# Patient Record
Sex: Female | Born: 1990 | Race: Black or African American | Hispanic: No | Marital: Single | Smoking: Former smoker
Health system: Southern US, Community
[De-identification: ages and names within clinical notes are randomized; demographics above are authoritative.]

## PROBLEM LIST (undated history)

## (undated) DIAGNOSIS — J302 Other seasonal allergic rhinitis: Secondary | ICD-10-CM

## (undated) DIAGNOSIS — I10 Essential (primary) hypertension: Secondary | ICD-10-CM

## (undated) DIAGNOSIS — M3214 Glomerular disease in systemic lupus erythematosus: Secondary | ICD-10-CM

## (undated) DIAGNOSIS — G43909 Migraine, unspecified, not intractable, without status migrainosus: Secondary | ICD-10-CM

## (undated) DIAGNOSIS — R319 Hematuria, unspecified: Secondary | ICD-10-CM

## (undated) DIAGNOSIS — R809 Proteinuria, unspecified: Secondary | ICD-10-CM

## (undated) HISTORY — DX: Hematuria, unspecified: R31.9

## (undated) HISTORY — DX: Migraine, unspecified, not intractable, without status migrainosus: G43.909

## (undated) HISTORY — PX: NO PAST SURGERIES: SHX2092

## (undated) HISTORY — DX: Essential (primary) hypertension: I10

---

## 2012-10-04 ENCOUNTER — Other Ambulatory Visit: Payer: Self-pay | Admitting: *Deleted

## 2012-10-07 ENCOUNTER — Other Ambulatory Visit: Payer: Self-pay

## 2012-10-11 ENCOUNTER — Other Ambulatory Visit: Payer: Self-pay

## 2012-10-19 ENCOUNTER — Other Ambulatory Visit: Payer: Self-pay

## 2013-03-06 ENCOUNTER — Other Ambulatory Visit (HOSPITAL_COMMUNITY): Payer: Self-pay | Admitting: Family Medicine

## 2013-03-06 DIAGNOSIS — R1031 Right lower quadrant pain: Secondary | ICD-10-CM

## 2013-03-07 ENCOUNTER — Ambulatory Visit (HOSPITAL_COMMUNITY)
Admission: RE | Admit: 2013-03-07 | Discharge: 2013-03-07 | Disposition: A | Discharge status: Home / Self Care | Payer: BC Managed Care – PPO | Source: Ambulatory Visit | Attending: Family Medicine | Admitting: Family Medicine

## 2013-03-07 DIAGNOSIS — R1031 Right lower quadrant pain: Secondary | ICD-10-CM | POA: Insufficient documentation

## 2013-03-07 DIAGNOSIS — R319 Hematuria, unspecified: Secondary | ICD-10-CM | POA: Insufficient documentation

## 2013-03-20 ENCOUNTER — Other Ambulatory Visit (HOSPITAL_COMMUNITY): Payer: Self-pay | Admitting: Family Medicine

## 2013-03-20 DIAGNOSIS — R109 Unspecified abdominal pain: Secondary | ICD-10-CM

## 2013-03-28 ENCOUNTER — Ambulatory Visit (HOSPITAL_COMMUNITY)
Admission: RE | Admit: 2013-03-28 | Discharge: 2013-03-28 | Disposition: A | Discharge status: Home / Self Care | Payer: BC Managed Care – PPO | Source: Ambulatory Visit | Attending: Family Medicine | Admitting: Family Medicine

## 2013-03-28 ENCOUNTER — Other Ambulatory Visit (HOSPITAL_COMMUNITY): Payer: Self-pay | Admitting: Family Medicine

## 2013-03-28 DIAGNOSIS — N926 Irregular menstruation, unspecified: Secondary | ICD-10-CM | POA: Insufficient documentation

## 2013-03-28 DIAGNOSIS — N949 Unspecified condition associated with female genital organs and menstrual cycle: Secondary | ICD-10-CM | POA: Insufficient documentation

## 2013-03-28 DIAGNOSIS — R109 Unspecified abdominal pain: Secondary | ICD-10-CM

## 2013-05-22 ENCOUNTER — Encounter (HOSPITAL_COMMUNITY): Payer: Self-pay | Admitting: Emergency Medicine

## 2013-05-22 ENCOUNTER — Emergency Department (HOSPITAL_COMMUNITY)
Admission: EM | Admit: 2013-05-22 | Discharge: 2013-05-22 | Disposition: A | Discharge status: Home / Self Care | Payer: BC Managed Care – PPO | Attending: Emergency Medicine | Admitting: Emergency Medicine

## 2013-05-22 DIAGNOSIS — Z880 Allergy status to penicillin: Secondary | ICD-10-CM | POA: Insufficient documentation

## 2013-05-22 DIAGNOSIS — Z79899 Other long term (current) drug therapy: Secondary | ICD-10-CM | POA: Insufficient documentation

## 2013-05-22 DIAGNOSIS — L600 Ingrowing nail: Secondary | ICD-10-CM | POA: Insufficient documentation

## 2013-05-22 NOTE — Discharge Instructions (Signed)
Follow up with Podiatry as listed above for further evaluation of your ingrown toe nail. Resource guide provided below for further follow up. Avoid narrow toe shoes, keep feet clean, and recommend warm soaks. Return if you develop any increaseed swelling, redness or drainage from toe.    Ingrown Toenail An ingrown toenail occurs when the sharp edge of a toenail grows into the skin of the groove beside the nail (lateral nail groove), causing pain. Ingrown toenails most commonly occur on the first (big) toe. If left untreated, an ingrown toenail may become inflamed and infected. The infection can even spread throughout the toe. SYMPTOMS   Pain, centered on the nail groove.  Tenderness and inflammation.  Pus drainage (occasionally).  Signs of infection: redness, swelling, pain, warm to the touch. CAUSES  Ingrown toenails are caused when the toenail grows into the neighboring fleshy nail fold. This may be the result of poor nail trimming or pressure from shoes.  RISK INCREASES WITH:   Curved nail formations.  Clipping toenails too far on the sides, which allows tissue to grow over the nail.  Poorly fitted shoes, especially those that press down on the toenails.  Sports that require sudden stops, causing the toe to jam into the shoe. PREVENTION   Trim toenails properly, making sure the sides are not clipped too short.  Wear properly fitted shoes.  Avoid excessive pressure on the toenails. PROGNOSIS  Ingrown toenails are usually curable within 1 week, depending on the presence or absence of an infection.  RELATED COMPLICATIONS   Chronic infection, that cannot be cured without surgery.  Spread of infection throughout the toe, or even the bone. TREATMENT  Treatment first involves resting from aggravating activities, wearing shoes that do not place pressure on the toenails, antibiotics (if infected), and soaking the foot in warm water (with or without Epsom salt). After the foot has  been soaked, you may be able to gently lift the nail from the fleshy tissue, and place a bit of cotton ball under the nail, easing the pressure. If you have been prescribed antibiotics, expect relief to begin up to 48 hours after taking the medicine. Symptoms usually go away within 1 week. For people who suffer from persistent (chronic) ingrown toenails, surgery may be advised. MEDICATION   If pain medicine is needed, nonsteroidal anti-inflammatory medicines (aspirin and ibuprofen), or other minor pain relievers (acetaminophen), are often advised.  Do not take pain medicine for 7 days before surgery.  Stronger pain relievers may be prescribed, if your caregiver thinks they are needed. Use only as directed and only as much as you need.  Antibiotics may be prescribed to fight infection. Take as directed by your caregiver. Finish the entire prescription, even if you start to feel better before you finish it. SOAKS AND COLD THERAPY   Soak the toe (or whole foot) for 20 minutes, twice a day, in a gallon of warm water. You may add 2 tablespoons of Epsom salts or a mild detergent.  Cold treatment (icing) should be applied for 10 to 15 minutes every 2 to 3 hours for inflammation and pain, and immediately after activity that aggravates your symptoms. Use ice packs or an ice massage. SEEK MEDICAL CARE IF:   Symptoms get worse or do not improve in 48 hours, despite treatment.  You develop fever, increased pain and swelling, or signs of infection (pain, redness, tenderness, swelling, warmth) in the toe, after surgery.  New, unexplained symptoms develop. (Drugs used in treatment may produce  side effects.) Document Released: 02/02/2005 Document Revised: 04/27/2011 Document Reviewed: 05/17/2008 [facility] Patient Information 2014 [place], [place].

## 2013-05-22 NOTE — ED Notes (Signed)
Pt ambulatory to exam room with steady gait.  

## 2013-05-22 NOTE — ED Notes (Signed)
Pt reports ingrown toenail to R great toe. Pt states she tried to remove it but doe not think she got it all and that toe has a purple tint to it. Pt alert and ambulatory in triage.

## 2013-05-22 NOTE — ED Provider Notes (Signed)
CSN: [id number]     Arrival date & time 05/22/13  2016 History   First MD Initiated Contact with Patient 05/22/13 2134     This chart was scribed for non-physician practitioner, Rudene Anda PA-C working with Nelia Shi, MD by Arlan Organ, ED Scribe. This patient was seen in room WTR7/WTR7 and the patient's care was started at 9:59 PM.   Chief Complaint  Patient presents with  . Nail Problem   The history is provided by the patient. No language interpreter was used.    HPI Comments: Julia Hall is a 23 y.o. female who presents to the Emergency Department complaining of an ingrown toenail to the R great toe x 1 month. Pt states she tried to remove the toenail but feels she only partially removed the nail. She has noted a purple tint to the toe and feel it may be infected. She is now experiencing mild achy pain and rates it 6/10. States her discomfort is exacerbated when wearing shoes. At this time she denies any fever, chills, numbness, or tingling. She has no pertinent medical history. No other concerns this visit.   History reviewed. No pertinent past medical history. History reviewed. No pertinent past surgical history. History reviewed. No pertinent family history. History  Substance Use Topics  . Smoking status: Never Smoker   . Smokeless tobacco: Not on file  . Alcohol Use: No   OB History   Grav Para Term Preterm Abortions TAB SAB Ect Mult Living                 Review of Systems  A complete 10 system review of systems was obtained and all systems are negative except as noted in the HPI and PMH.    Allergies  Penicillins  Home Medications   Current Outpatient Rx  Name  Route  Sig  Dispense  Refill  . cetirizine (ZYRTEC) 10 MG tablet   Oral   Take 10 mg by mouth daily.         Marland Kitchen ibuprofen (ADVIL,MOTRIN) 200 MG tablet   Oral   Take 400 mg by mouth every 6 (six) hours as needed (pain).          Triage Vitals: BP 114/70  Pulse 84  Temp(Src) 97.6  F (36.4 C) (Oral)  Resp 20  Ht 5\' 6"  (1.676 m)  Wt 187 lb (84.823 kg)  BMI 30.20 kg/m2  SpO2 100%  LMP 05/22/2013   Physical Exam  Nursing note and vitals reviewed. Constitutional: She is oriented to person, place, and time. She appears well-developed and well-nourished. No distress.  HENT:  Head: Normocephalic and atraumatic.  Eyes: Conjunctivae are normal.  Neck: No tracheal deviation present.  Pulmonary/Chest: Effort normal. No respiratory distress.  Musculoskeletal: Normal range of motion. She exhibits tenderness. She exhibits no edema.  Ingrown toenails to lateral aspect of R great toe No fluctuance No signs of abscess Minimally swollen No erythema or warmness to touch No drainage  Neurological: She is alert and oriented to person, place, and time.  Skin: Skin is warm and dry. She is not diaphoretic.  Psychiatric: She has a normal mood and affect. Her behavior is normal.    ED Course  Procedures (including critical care time)  DIAGNOSTIC STUDIES: Oxygen Saturation is 100% on RA, Normal by my interpretation.    COORDINATION OF CARE: 10:05 PM-Discussed treatment plan with pt at bedside and pt agreed to plan.     Labs Review Labs Reviewed - No  data to display Imaging Review No results found.   EKG Interpretation None      MDM   Final diagnoses:  Nail, ingrown   No evidence of infection at this time. No need for emergent toenail removal. Will refer pt to podiatry for further evaluation and follow up. Advised pt to do warm foot soaks daily, along with wearing wide based shoes or opened toe sandals to avoid pressure to affected nail.    I personally performed the services described in this documentation, which was scribed in my presence. The recorded information has been reviewed and is accurate.   Allen NorrisJacob Gray LynnvilleLackey, PA-C 05/25/13 [phone]

## 2013-05-27 NOTE — ED Provider Notes (Signed)
Medical screening examination/treatment/procedure(s) were performed by non-physician practitioner and as supervising physician I was immediately available for consultation/collaboration.   Marlei Glomski L Charis Juliana, MD 05/27/13 1120 

## 2013-06-02 ENCOUNTER — Ambulatory Visit (INDEPENDENT_AMBULATORY_CARE_PROVIDER_SITE_OTHER): Payer: BC Managed Care – PPO

## 2013-06-02 VITALS — BP 120/74 | HR 72 | Resp 18

## 2013-06-02 DIAGNOSIS — L03039 Cellulitis of unspecified toe: Secondary | ICD-10-CM

## 2013-06-02 DIAGNOSIS — L600 Ingrowing nail: Secondary | ICD-10-CM

## 2013-06-02 MED ORDER — SULFAMETHOXAZOLE-TMP DS 800-160 MG PO TABS: 1.0000 | ORAL_TABLET | Freq: Two times a day (BID) | ORAL | Status: DC

## 2013-06-02 NOTE — Patient Instructions (Signed)

## 2013-06-02 NOTE — Progress Notes (Signed)
   Subjective:    Patient ID: Julia Hall, female    DOB: May 28, 1990, 23 y.o.   MRN: [id number]  HPI my right big toenail has been hurting over a month and I took tweezers and messed with it last night and yellowish color came out and hurts with shoes and I have not soaked it    Review of Systems  All other systems reviewed and are negative.      Objective:   Physical Exam Neurovascular status is intact pedal pulses palpable DP and PT posterior were for capillary refill time 3 seconds epicritic and proprioceptive sensations intact and symmetric. Patient did complain of ingrowing lateral border of right great toe been going on and had multiple episodes in the past most recent last 2-3 weeks is no edema erythema and some discharge drainage. Painful tender symptomatic has had problems in the left foot as well although currently left is not symptomatic. Neurovascular status otherwise intact no other open wounds ulcerations.       Assessment & Plan:  Assessment ingrowing nail lateral border right hallux with paronychia plan at this time reclamation for permanent nail excision lateral nail fold for nail border risk O. is reviewed with the patient this time local anesthetic block is administered 3 cc 50-50 mixture of 2% Xylocaine plain and 0.5% Marcaine plain Betadine prep was performed the nail border is excised phenol matricectomy followed by an alcohol wash Silvadene cream and gauze dressing being applied. Patient given instructions for daily Betadine warm water soak recommended Tylenol as the for pain prescription for Septra DS is also for the pharmacy Septra DS twice a day x10 days. Recommend Tylenol aspirin or Advil as needed for pain recheck in 2-3 weeks for postop followup for nail check  Alvan Dameichard Trueman Worlds DPM

## 2013-06-16 ENCOUNTER — Ambulatory Visit (INDEPENDENT_AMBULATORY_CARE_PROVIDER_SITE_OTHER): Payer: BC Managed Care – PPO

## 2013-06-16 VITALS — BP 108/65 | HR 78 | Resp 12

## 2013-06-16 DIAGNOSIS — Z09 Encounter for follow-up examination after completed treatment for conditions other than malignant neoplasm: Secondary | ICD-10-CM

## 2013-06-16 DIAGNOSIS — L600 Ingrowing nail: Secondary | ICD-10-CM

## 2013-06-16 NOTE — Patient Instructions (Signed)

## 2013-06-16 NOTE — Progress Notes (Signed)
   Subjective:    Patient ID: Julia Hall, female    DOB: 1990-04-12, 23 y.o.   MRN: [id number]  HPI   RT FOOT GREAT TOENAIL IS DOING MUCH BETTER.   Review of Systems no new systemic changes or findings     Objective:   Physical Exam NV SI, pedal pulses palpable no pain or tenderness mild eschar noted lateral nail fold right great toe. Patient is two-week status post AP nail procedure doing well keeping Neosporin and Band-Aid on during the day but it air dry during the night     Assessment & Plan:  Assessment good postop progress discharge to an as-needed basis for followup Neosporin and Band-Aid applied the skin may discontinue was drainage completely stopped discharge to an as-needed basis for followup  Julia Hall DPM

## 2013-09-06 ENCOUNTER — Emergency Department (HOSPITAL_COMMUNITY)
Admission: EM | Admit: 2013-09-06 | Discharge: 2013-09-07 | Disposition: A | Discharge status: Home / Self Care | Payer: BC Managed Care – PPO | Attending: Emergency Medicine | Admitting: Emergency Medicine

## 2013-09-06 ENCOUNTER — Emergency Department (HOSPITAL_COMMUNITY): Payer: BC Managed Care – PPO

## 2013-09-06 ENCOUNTER — Encounter (HOSPITAL_COMMUNITY): Payer: Self-pay | Admitting: Emergency Medicine

## 2013-09-06 DIAGNOSIS — Z79899 Other long term (current) drug therapy: Secondary | ICD-10-CM | POA: Insufficient documentation

## 2013-09-06 DIAGNOSIS — R1031 Right lower quadrant pain: Secondary | ICD-10-CM | POA: Insufficient documentation

## 2013-09-06 DIAGNOSIS — K5900 Constipation, unspecified: Secondary | ICD-10-CM | POA: Insufficient documentation

## 2013-09-06 DIAGNOSIS — Z880 Allergy status to penicillin: Secondary | ICD-10-CM | POA: Insufficient documentation

## 2013-09-06 DIAGNOSIS — Z3202 Encounter for pregnancy test, result negative: Secondary | ICD-10-CM | POA: Insufficient documentation

## 2013-09-06 LAB — URINALYSIS, ROUTINE W REFLEX MICROSCOPIC
Bilirubin Urine: NEGATIVE
GLUCOSE, UA: NEGATIVE mg/dL
Hgb urine dipstick: NEGATIVE
Ketones, ur: NEGATIVE mg/dL
LEUKOCYTES UA: NEGATIVE
Nitrite: NEGATIVE
PROTEIN: 100 mg/dL — AB
SPECIFIC GRAVITY, URINE: 1.026 (ref 1.005–1.030)
UROBILINOGEN UA: 1 mg/dL (ref 0.0–1.0)
pH: 6.5 (ref 5.0–8.0)

## 2013-09-06 LAB — COMPREHENSIVE METABOLIC PANEL
ALT: 45 U/L — ABNORMAL HIGH (ref 0–35)
AST: 30 U/L (ref 0–37)
Albumin: 4.2 g/dL (ref 3.5–5.2)
Alkaline Phosphatase: 85 U/L (ref 39–117)
Anion gap: 14 (ref 5–15)
BILIRUBIN TOTAL: 0.4 mg/dL (ref 0.3–1.2)
BUN: 10 mg/dL (ref 6–23)
CALCIUM: 9.7 mg/dL (ref 8.4–10.5)
CHLORIDE: 100 meq/L (ref 96–112)
CO2: 25 meq/L (ref 19–32)
Creatinine, Ser: 0.64 mg/dL (ref 0.50–1.10)
GFR calc Af Amer: >90 mL/min (ref >90)
Glucose, Bld: 85 mg/dL (ref 70–99)
Potassium: 4 mEq/L (ref 3.7–5.3)
Sodium: 139 mEq/L (ref 137–147)
Total Protein: 8.3 g/dL (ref 6.0–8.3)

## 2013-09-06 LAB — CBC WITH DIFFERENTIAL/PLATELET
Basophils Absolute: 0 10*3/uL (ref 0.0–0.1)
Basophils Relative: 0 % (ref 0–1)
EOS PCT: 1 % (ref 0–5)
Eosinophils Absolute: 0.1 10*3/uL (ref 0.0–0.7)
HEMATOCRIT: 43.9 % (ref 36.0–46.0)
HEMOGLOBIN: 14.6 g/dL (ref 12.0–15.0)
LYMPHS ABS: 2.6 10*3/uL (ref 0.7–4.0)
LYMPHS PCT: 30 % (ref 12–46)
MCH: 28.5 pg (ref 26.0–34.0)
MCHC: 33.3 g/dL (ref 30.0–36.0)
MCV: 85.6 fL (ref 78.0–100.0)
MONO ABS: 0.6 10*3/uL (ref 0.1–1.0)
MONOS PCT: 6 % (ref 3–12)
NEUTROS ABS: 5.5 10*3/uL (ref 1.7–7.7)
Neutrophils Relative %: 63 % (ref 43–77)
Platelets: 261 10*3/uL (ref 150–400)
RBC: 5.13 MIL/uL — AB (ref 3.87–5.11)
RDW: 13.5 % (ref 11.5–15.5)
WBC: 8.8 10*3/uL (ref 4.0–10.5)

## 2013-09-06 LAB — PREGNANCY, URINE: Preg Test, Ur: NEGATIVE

## 2013-09-06 LAB — WET PREP, GENITAL
Clue Cells Wet Prep HPF POC: NONE SEEN
Trich, Wet Prep: NONE SEEN
Yeast Wet Prep HPF POC: NONE SEEN

## 2013-09-06 LAB — URINE MICROSCOPIC-ADD ON

## 2013-09-06 LAB — LIPASE, BLOOD: Lipase: 31 U/L (ref 11–59)

## 2013-09-06 MED ORDER — MORPHINE SULFATE 4 MG/ML IJ SOLN
4.0000 mg | Freq: Once | INTRAMUSCULAR | Status: AC
  Administered 2013-09-06: 4 mg via INTRAVENOUS
  Filled 2013-09-06: qty 1

## 2013-09-06 MED ORDER — IOHEXOL 300 MG/ML  SOLN
50.0000 mL | Freq: Once | INTRAMUSCULAR | Status: AC | PRN
  Administered 2013-09-06: 50 mL via ORAL

## 2013-09-06 MED ORDER — ONDANSETRON HCL 4 MG PO TABS: 4.0000 mg | ORAL_TABLET | Freq: Four times a day (QID) | ORAL | Status: DC

## 2013-09-06 MED ORDER — IOHEXOL 300 MG/ML  SOLN
100.0000 mL | Freq: Once | INTRAMUSCULAR | Status: AC | PRN
  Administered 2013-09-06: 100 mL via INTRAVENOUS

## 2013-09-06 MED ORDER — ONDANSETRON HCL 4 MG/2ML IJ SOLN
4.0000 mg | Freq: Once | INTRAMUSCULAR | Status: AC
  Administered 2013-09-06: 4 mg via INTRAVENOUS
  Filled 2013-09-06: qty 2

## 2013-09-06 MED ORDER — HYDROCODONE-ACETAMINOPHEN 5-325 MG PO TABS: 1.0000 | ORAL_TABLET | Freq: Four times a day (QID) | ORAL | Status: DC | PRN

## 2013-09-06 NOTE — ED Provider Notes (Signed)
CSN: [id number]     Arrival date & time 09/06/13  1912 History   First MD Initiated Contact with Patient 09/06/13 1949     Chief Complaint  Patient presents with  . Abdominal Pain  . Constipation     (Consider location/radiation/quality/duration/timing/severity/associated sxs/prior Treatment) HPI Comments: Patient is an otherwise healthy 23 year old female who presents today with 2 days of gradually worsening abdominal pain. She states that it is a sharp, stabbing pain. It does not radiate. The pain has been constant. Nothing makes the pain better or worse. She does not have associated nausea, vomiting, diarrhea, dysuria, urinary urgency, urinary frequency, vaginal discharge, vaginal bleeding. She does report constipation. She had a normal bowel movement yesterday, but normally has bowel movements daily.   The history is provided by the patient. No language interpreter was used.    History reviewed. No pertinent past medical history. History reviewed. No pertinent past surgical history. No family history on file. History  Substance Use Topics  . Smoking status: Never Smoker   . Smokeless tobacco: Never Used  . Alcohol Use: No   OB History   Grav Para Term Preterm Abortions TAB SAB Ect Mult Living                 Review of Systems  Constitutional: Negative for fever and chills.  Respiratory: Negative for shortness of breath.   Cardiovascular: Negative for chest pain.  Gastrointestinal: Positive for abdominal pain. Negative for nausea, vomiting and diarrhea.  Genitourinary: Negative for dysuria, frequency, flank pain, vaginal bleeding, vaginal discharge, difficulty urinating and vaginal pain.  All other systems reviewed and are negative.     Allergies  Penicillins  Home Medications   Prior to Admission medications   Medication Sig Start Date End Date Taking? Authorizing Provider  cetirizine (ZYRTEC) 10 MG tablet Take 10 mg by mouth daily.   Yes Historical Provider, MD    BP 116/64  Pulse 89  Temp(Src) 98.1 F (36.7 C) (Oral)  Resp 16  Ht 5\' 6"  (1.676 m)  Wt 189 lb (85.73 kg)  BMI 30.52 kg/m2  SpO2 100%  LMP 08/07/2013 Physical Exam  Nursing note and vitals reviewed. Constitutional: She is oriented to person, place, and time. She appears well-developed and well-nourished. No distress.  Patient appears comfortable.   HENT:  Head: Normocephalic and atraumatic.  Right Ear: External ear normal.  Left Ear: External ear normal.  Nose: Nose normal.  Mouth/Throat: Oropharynx is clear and moist.  Eyes: Conjunctivae are normal.  Neck: Normal range of motion.  Cardiovascular: Normal rate, regular rhythm and normal heart sounds.   Pulmonary/Chest: Effort normal and breath sounds normal. No stridor. No respiratory distress. She has no wheezes. She has no rales.  Abdominal: Soft. Bowel sounds are normal. She exhibits no distension. There is tenderness in the right lower quadrant. There is no rigidity, no rebound and no guarding.  Tenderness to deep palpation of RLQ  Genitourinary: There is no rash, tenderness or lesion on the right labia. There is no rash, tenderness or lesion on the left labia. Uterus is not tender. Cervix exhibits no motion tenderness, no discharge and no friability. Right adnexum displays tenderness. Right adnexum displays no mass and no fullness. Left adnexum displays tenderness. Left adnexum displays no mass and no fullness. No erythema, tenderness or bleeding around the vagina. No foreign body around the vagina. No signs of injury around the vagina. No vaginal discharge found.  Musculoskeletal: Normal range of motion.  Neurological: She  is alert and oriented to person, place, and time. She has normal strength.  Skin: Skin is warm and dry. She is not diaphoretic. No erythema.  Psychiatric: She has a normal mood and affect. Her behavior is normal.    ED Course  Procedures (including critical care time) Labs Review Labs Reviewed  WET  PREP, GENITAL - Abnormal; Notable for the following:    WBC, Wet Prep HPF POC FEW (*)    All other components within normal limits  CBC WITH DIFFERENTIAL - Abnormal; Notable for the following:    RBC 5.13 (*)    All other components within normal limits  COMPREHENSIVE METABOLIC PANEL - Abnormal; Notable for the following:    ALT 45 (*)    All other components within normal limits  URINALYSIS, ROUTINE W REFLEX MICROSCOPIC - Abnormal; Notable for the following:    APPearance CLOUDY (*)    Protein, ur 100 (*)    All other components within normal limits  URINE MICROSCOPIC-ADD ON - Abnormal; Notable for the following:    Squamous Epithelial / LPF FEW (*)    Bacteria, UA FEW (*)    All other components within normal limits  URINE CULTURE  GC/CHLAMYDIA PROBE AMP  LIPASE, BLOOD  PREGNANCY, URINE  POC URINE PREG, ED    Imaging Review Ct Abdomen Pelvis W Contrast  09/06/2013   CLINICAL DATA:  Rebound tenderness at the right lower quadrant.  EXAM: CT ABDOMEN AND PELVIS WITH CONTRAST  TECHNIQUE: Multidetector CT imaging of the abdomen and pelvis was performed using the standard protocol following bolus administration of intravenous contrast.  CONTRAST:  100 mL of Omnipaque 300 IV contrast  COMPARISON:  Pelvic ultrasound performed 03/28/2013  FINDINGS: The visualized lung bases are clear.  The liver and spleen are unremarkable in appearance. The gallbladder is within normal limits. The pancreas and adrenal glands are unremarkable.  The kidneys are unremarkable in appearance. There is no evidence of hydronephrosis. No renal or ureteral stones are seen. No perinephric stranding is appreciated.  No free fluid is identified. The small bowel is unremarkable in appearance. The stomach is within normal limits. No acute vascular abnormalities are seen.  The appendix is only partially characterized but is filled with air, without evidence for appendicitis. The colon is unremarkable in appearance.  The bladder  is mildly distended and grossly unremarkable. The uterus is within normal limits. The ovaries are relatively symmetric. No suspicious adnexal masses are seen. No inguinal lymphadenopathy is seen.  No acute osseous abnormalities are identified.  IMPRESSION: No acute abnormality seen within the abdomen or pelvis.   Electronically Signed   By: Roanna RaiderJeffery  Chang M.D.   On: 09/06/2013 23:33     EKG Interpretation None      MDM   Final diagnoses:  Right lower quadrant abdominal pain    Patient is nontoxic, nonseptic appearing, in no apparent distress. Patient's pain and other symptoms adequately managed in emergency department.  Fluid bolus given.  Labs, imaging and vitals reviewed.  Patient does not meet the SIRS or Sepsis criteria.  On repeat exam patient does not have a surgical abdomen and there are no peritoneal signs.  No indication of appendicitis, bowel obstruction, bowel perforation, cholecystitis, diverticulitis, PID, TOA, ovarian torsion, or ectopic pregnancy. Discussed possibility of ovarian cyst with patient. Patient discharged home with symptomatic treatment and given strict instructions for follow-up with their primary care physician.  I have also discussed reasons to return immediately to the ER.  Patient expresses understanding  and agrees with plan. Dr. Freida Busman evaluated patient and agrees with plan.        Mora Bellman, PA-C 09/07/13 0104

## 2013-09-06 NOTE — ED Notes (Signed)
Pt ambulatory to room.

## 2013-09-06 NOTE — ED Notes (Signed)
Pt is aware of the need for urine sample.  

## 2013-09-06 NOTE — ED Provider Notes (Signed)
Medical screening examination/treatment/procedure(s) were conducted as a shared visit with non-physician practitioner(s) and myself.  I personally evaluated the patient during the encounter.   EKG Interpretation None     Patient here with 2 days of colicky right lower quadrant pain. Abdominal exam shows tenderness to palpation the patient's right lower quadrant without peritoneal signs. Will obtain abdominal CT.  Toy BakerAnthony T Ardell Makarewicz, MD 09/06/13 2201

## 2013-09-06 NOTE — ED Notes (Signed)
Patient transported to CT 

## 2013-09-06 NOTE — Discharge Instructions (Signed)
Abdominal Pain, Women °Abdominal (stomach, pelvic, or belly) pain can be caused by many things. It is important to tell your doctor: °· The location of the pain. °· Does it come and go or is it present all the time? °· Are there things that start the pain (eating certain foods, exercise)? °· Are there other symptoms associated with the pain (fever, nausea, vomiting, diarrhea)? °All of this is helpful to know when trying to find the cause of the pain. °CAUSES  °· Stomach: virus or bacteria infection, or ulcer. °· Intestine: appendicitis (inflamed appendix), regional ileitis (Crohn's disease), ulcerative colitis (inflamed colon), irritable bowel syndrome, diverticulitis (inflamed diverticulum of the colon), or cancer of the stomach or intestine. °· Gallbladder disease or stones in the gallbladder. °· Kidney disease, kidney stones, or infection. °· Pancreas infection or cancer. °· Fibromyalgia (pain disorder). °· Diseases of the female organs: °¨ Uterus: fibroid (non-cancerous) tumors or infection. °¨ Fallopian tubes: infection or tubal pregnancy. °¨ Ovary: cysts or tumors. °¨ Pelvic adhesions (scar tissue). °¨ Endometriosis (uterus lining tissue growing in the pelvis and on the pelvic organs). °¨ Pelvic congestion syndrome (female organs filling up with blood just before the menstrual period). °¨ Pain with the menstrual period. °¨ Pain with ovulation (producing an egg). °¨ Pain with an IUD (intrauterine device, birth control) in the uterus. °¨ Cancer of the female organs. °· Functional pain (pain not caused by a disease, may improve without treatment). °· Psychological pain. °· Depression. °DIAGNOSIS  °Your doctor will decide the seriousness of your pain by doing an examination. °· Blood tests. °· X-rays. °· Ultrasound. °· CT scan (computed tomography, special type of X-ray). °· MRI (magnetic resonance imaging). °· Cultures, for infection. °· Barium enema (dye inserted in the large intestine, to better view it with  X-rays). °· Colonoscopy (looking in intestine with a lighted tube). °· Laparoscopy (minor surgery, looking in abdomen with a lighted tube). °· Major abdominal exploratory surgery (looking in abdomen with a large incision). °TREATMENT  °The treatment will depend on the cause of the pain.  °· Many cases can be observed and treated at home. °· Over-the-counter medicines recommended by your caregiver. °· Prescription medicine. °· Antibiotics, for infection. °· Birth control pills, for painful periods or for ovulation pain. °· Hormone treatment, for endometriosis. °· Nerve blocking injections. °· Physical therapy. °· Antidepressants. °· Counseling with a psychologist or psychiatrist. °· Minor or major surgery. °HOME CARE INSTRUCTIONS  °· Do not take laxatives, unless directed by your caregiver. °· Take over-the-counter pain medicine only if ordered by your caregiver. Do not take aspirin because it can cause an upset stomach or bleeding. °· Try a clear liquid diet (broth or water) as ordered by your caregiver. Slowly move to a bland diet, as tolerated, if the pain is related to the stomach or intestine. °· Have a thermometer and take your temperature several times a day, and record it. °· Bed rest and sleep, if it helps the pain. °· Avoid sexual intercourse, if it causes pain. °· Avoid stressful situations. °· Keep your follow-up appointments and tests, as your caregiver orders. °· If the pain does not go away with medicine or surgery, you may try: °¨ Acupuncture. °¨ Relaxation exercises (yoga, meditation). °¨ Group therapy. °¨ Counseling. °SEEK MEDICAL CARE IF:  °· You notice certain foods cause stomach pain. °· Your home care treatment is not helping your pain. °· You need stronger pain medicine. °· You want your IUD removed. °· You feel faint or   lightheaded. °· You develop nausea and vomiting. °· You develop a rash. °· You are having side effects or an allergy to your medicine. °SEEK IMMEDIATE MEDICAL CARE IF:  °· Your  pain does not go away or gets worse. °· You have a fever. °· Your pain is felt only in portions of the abdomen. The right side could possibly be appendicitis. The left lower portion of the abdomen could be colitis or diverticulitis. °· You are passing blood in your stools (bright red or black tarry stools, with or without vomiting). °· You have blood in your urine. °· You develop chills, with or without a fever. °· You pass out. °MAKE SURE YOU:  °· Understand these instructions. °· Will watch your condition. °· Will get help right away if you are not doing well or get worse. °Document Released: 11/30/2006 Document Revised: 06/19/2013 Document Reviewed: 12/20/2008 °[employer]® Patient Information ©2015 [employer]. This information is not intended to replace advice given to you by your health care provider. Make sure you discuss any questions you have with your health care provider. ° °

## 2013-09-06 NOTE — ED Notes (Addendum)
Pt states she started having RLQ ABD pain two days ago. Pt also states she has been constipated. Denies n/v, burning or painful urination, or abnormal vaginal discharge.

## 2013-09-06 NOTE — ED Notes (Addendum)
Pt c/o rebound tenderness in RLQ and notes that pain increases "a little bit" when she walks. Pt still has appendix. Pt sts last bowel movement was two days ago.

## 2013-09-07 LAB — GC/CHLAMYDIA PROBE AMP
CT PROBE, AMP APTIMA: NEGATIVE
GC Probe RNA: NEGATIVE

## 2013-09-07 NOTE — ED Provider Notes (Signed)
Medical screening examination/treatment/procedure(s) were performed by non-physician practitioner and as supervising physician I was immediately available for consultation/collaboration.  Claiborne Stroble T Erwin Nishiyama, MD 09/07/13 2136 

## 2013-09-08 LAB — URINE CULTURE: Colony Count: 5000

## 2013-11-06 ENCOUNTER — Encounter (HOSPITAL_COMMUNITY): Payer: Self-pay | Admitting: Emergency Medicine

## 2013-11-06 ENCOUNTER — Emergency Department (HOSPITAL_COMMUNITY)
Admission: EM | Admit: 2013-11-06 | Discharge: 2013-11-06 | Disposition: A | Discharge status: Home / Self Care | Payer: BC Managed Care – PPO | Attending: Emergency Medicine | Admitting: Emergency Medicine

## 2013-11-06 DIAGNOSIS — R509 Fever, unspecified: Secondary | ICD-10-CM | POA: Insufficient documentation

## 2013-11-06 DIAGNOSIS — Z880 Allergy status to penicillin: Secondary | ICD-10-CM | POA: Insufficient documentation

## 2013-11-06 DIAGNOSIS — R059 Cough, unspecified: Secondary | ICD-10-CM | POA: Insufficient documentation

## 2013-11-06 DIAGNOSIS — H6692 Otitis media, unspecified, left ear: Secondary | ICD-10-CM

## 2013-11-06 DIAGNOSIS — Z3202 Encounter for pregnancy test, result negative: Secondary | ICD-10-CM | POA: Insufficient documentation

## 2013-11-06 DIAGNOSIS — H6690 Otitis media, unspecified, unspecified ear: Secondary | ICD-10-CM | POA: Insufficient documentation

## 2013-11-06 DIAGNOSIS — J029 Acute pharyngitis, unspecified: Secondary | ICD-10-CM | POA: Insufficient documentation

## 2013-11-06 DIAGNOSIS — R05 Cough: Secondary | ICD-10-CM | POA: Insufficient documentation

## 2013-11-06 DIAGNOSIS — Z79899 Other long term (current) drug therapy: Secondary | ICD-10-CM | POA: Insufficient documentation

## 2013-11-06 DIAGNOSIS — J3489 Other specified disorders of nose and nasal sinuses: Secondary | ICD-10-CM | POA: Insufficient documentation

## 2013-11-06 LAB — RAPID STREP SCREEN (MED CTR MEBANE ONLY): STREPTOCOCCUS, GROUP A SCREEN (DIRECT): NEGATIVE

## 2013-11-06 LAB — PREGNANCY, URINE: PREG TEST UR: NEGATIVE

## 2013-11-06 MED ORDER — CLINDAMYCIN HCL 150 MG PO CAPS: 450.0000 mg | ORAL_CAPSULE | Freq: Three times a day (TID) | ORAL | Status: DC

## 2013-11-06 NOTE — ED Notes (Signed)
Pt states she is having flu like symptoms of chills, fever, runny nose, cough and sneezing x 4 days.

## 2013-11-06 NOTE — Discharge Instructions (Signed)
Please follow the directions provided.  Be sure to follow up with your primary care provider.    SEEK IMMEDIATE MEDICAL CARE IF:  You have pain that is not controlled with medicine.  You have swelling, redness, or pain around your ear or stiffness in your neck.  You notice that part of your face is paralyzed.  You notice that the bone behind your ear (mastoid) is tender when you touch it.   Emergency Department Resource Guide 1) Find a Doctor and Pay Out of Pocket Although you won't have to find out who is covered by your insurance plan, it is a good idea to ask around and get recommendations. You will then need to call the office and see if the doctor you have chosen will accept you as a new patient and what types of options they offer for patients who are self-pay. Some doctors offer discounts or will set up payment plans for their patients who do not have insurance, but you will need to ask so you aren't surprised when you get to your appointment.  2) Contact Your Local Health Department Not all health departments have doctors that can see patients for sick visits, but many do, so it is worth a call to see if yours does. If you don't know where your local health department is, you can check in your phone book. The CDC also has a tool to help you locate your state's health department, and many state websites also have listings of all of their local health departments.  3) Find a Walk-in Clinic If your illness is not likely to be very severe or complicated, you may want to try a walk in clinic. These are popping up all over the country in pharmacies, drugstores, and shopping centers. They're usually staffed by nurse practitioners or physician assistants that have been trained to treat common illnesses and complaints. They're usually fairly quick and inexpensive. However, if you have serious medical issues or chronic medical problems, these are probably not your best option.  No Primary Care  Doctor: - Call Health Connect at  [phone] - they can help you locate a primary care doctor that  accepts your insurance, provides certain services, etc. - Physician Referral Service- [phone]  Chronic Pain Problems: Organization         Address  Phone   Notes  [facility]  [phone] Patients need to be referred by their primary care doctor.   Medication Assistance: Organization         Address  Phone   Notes  [facility] Medication [facility] [address] [place] [place], [place] [address] [phone] --Must be a resident of [facility] -- Must have NO insurance coverage whatsoever (no Medicaid/ Medicare, etc.) -- The pt. MUST have a primary care doctor that directs their care regularly and follows them in the community   [facility]ist  [phone]   [employer]  [phone]    Agencies that provide inexpensive medical care: Organization         Address  Phone   Notes  [facility]  [phone]   [facility]    [phone]   [facility] [address] [place], [place] [address] [phone]   [facility] [address] [place] [place] [phone]   Planned Parenthood    [phone]   [facility]    [phone]  [facility]  [address], [place] Phone:  [phone], Fax:  [phone] Hours of Operation:  9 am - 6 pm, M-F.  Also accepts Medicaid/Medicare and self-pay.  [facility]  [address], [place] Phone: [phone], Fax: [phone]. Hours of Operation:  8:30 am - 5:30 pm, M-F.  Also accepts Medicaid and self-pay.  [facility] [place] [address], [place] Phone: [phone]   [facility] [address], [place], [place] [phone], Ext. [phone] Mondays & Thursdays: 7-9 AM.  First 15 patients are seen on a first  come, first serve basis.    Medicaid-accepting [facility] Providers:  Organization         Address  Phone   Notes  [facility] [address], [place] [phone] Also accepts self-pay patients.  [facility] [address], [place]  [phone]   [facility] [address], [place] [phone]) [phone]   [facility] [address] [place] [address], [place] [phone]   Renaye RakersVeita Bland [address], [place]   [phone]) [phone] Only accepts [place] Access [place]icaid patients after they have their name applied to their card.   Self-Pay (no insurance) in [facility]:  Organization         Address  Phone   Notes  Sickle Cell Patients, [facility] [address], [place] [phone]   [facility] [address], [place] [phone]   [facility]  [address], [place] [phone](336) [phone]   [facility]/Dr. Osei-Bonsu  [address] [place] [address], [place] or [phone], [place] [phone]) [phone] Phone number for both [place] and [place] locations is the same.  [facility] [address], [place] [phone]) [phone]   [facility] [place] [address] [place] [address], [place] or [address] [phone]) [phone]   [facility] [address], [place] [phone]) [phone], phone; [phone], fax Sees patients 1st and 3rd Saturday of every month.  Must not qualify for public or private insurance (i.e. Medicaid, Medicare, [place] Health Choice, Veterans' Benefits)  Household income should be no more than 200% of the poverty level The clinic cannot treat you if you are pregnant or think you are pregnant  Sexually transmitted diseases are not treated at the clinic.    Dental Care: Organization          Address  Phone  Notes  [facility] Department of [facility] [address], [place] [phone]) [phone] Accepts children up to age 22 who are enrolled in [place]icaid or [place] Health Choice; pregnant women with a Medicaid card; and children who have applied for Medicaid or [place] Health Choice, but were declined, whose parents can pay a reduced fee at time of service.  [facility] of [facility] [place]  [address], [place] [phone] Accepts children up to age 22 who are enrolled in [place]icaid or [place] Health Choice; pregnant women with a Medicaid card; and children who have applied for Medicaid or [place] Health Choice, but were declined, whose parents can pay a reduced fee at time of service.  [facility]  [phone]  [address], [place] [phone]) [phone] Patients are seen by appointment only. Walk-ins are not accepted. [facility] will see patients 22 years of age and older. Monday - Tuesday (8am-5pm) Most Wednesdays (8:30-5pm) $30 per visit, cash only  [facility]  [address], [facility] [phone] Patients are seen by appointment only. Walk-ins are not accepted. [facility] will see patients 22 years of age and older. One Wednesday Evening (Monthly: Volunteer Based).  $30 per visit, cash only  [facility]  [phone] for adults; Children under age 154, call Graduate Pediatric Dentistry at [phone]. Children aged 464-14, please call [phone] to request a pediatric application.  Dental services are provided in all areas of dental care including fillings, crowns and bridges, complete and partial dentures, implants, gum treatment, root canals, and extractions. Preventive care is also provided. Treatment is provided to both adults and children. Patients are selected via a lottery and there is often a waiting list.   [facility] [address], [place]  [phone] [email or web address]   [facility] [address], [place], [place] [phone], Ext. [phone] Second and Fourth Thursday of each month, opens at 6:30 AM; Clinic ends at 9 AM.  Patients are seen on a first-come first-served basis, and a limited number are seen during each clinic.   [facility]  [address], [place], [place] [phone]) [phone]   Eligibility Requirements You must have lived in [place], [place], or [place] counties for at least the last three months.   You cannot be eligible for state or federal sponsored National Cityhealthcare insurance, including CIGNAVeterans Administration, [place]icaid, or [employer]Medicare.   You generally cannot be eligible for healthcare insurance through your employer.    How to apply: Eligibility screenings are held every Tuesday and Wednesday afternoon from 1:00 pm until 4:00 pm. You do not need an appointment for the interview!  [facility] [address], [place], [place] [phone]   [facility]  [phone]   [facility]  [phone]   [facility]  [phone]    Behavioral Health Resources in the Community: Intensive Outpatient Programs Organization         Address  Phone  Notes  [facility] [address], [place], [place] [phone]   [facility] [address], [place], [place] [phone]   [facility] [address], [place], [place]  [phone]   [facility] [address],  [place], [place] [phone] or [phone]   Substance Abuse Resources Organization         Address  Phone  Notes  [facility]  [phone]   [facility]  [phone]   [facility]  [phone]   [facility]  [phone]   Residential & Outpatient Substance Abuse Program  [phone]   Psychological  Services Organization         Address  Phone  Notes  [facility]  [phone]- [phone]   [facility]  [phone]- [phone]   [facility] [address], [place] [phone] or [phone]    Mobile Crisis Teams Organization         Address  Phone  Notes  [facility], Mobile Crisis Care Unit  [phone]   [facility]  [address] [place], [place] [phone]  [facility] [address] [place] [phone]    Self-Help/Support Groups Organization         Address  Phone             Notes  [facility] [place] - variety of support groups  336- I7437963 Call for more information  Narcotics Anonymous (NA), [facility]aring [facility] [address], [employer] [place]  2 meetings at this location   Statistician         Address  Phone  Notes  [facility] [address],    [place]  [phone]   [facility]  [address], [place] [address], [place], [place] [phone]   [facility] [address] [place], [place] [phone] Admissions: 8am-3pm M-F  [facility] [address]    [place], [place] [phone]   [facility] [address], [place], [place] [phone]   [facility] [address],  [place], [place] [phone]   [facility] - Intensive Outpatient [address], [place], [place] [phone]   [facility] ([facility] [address] [place].,  [place], [place] [phone] or [phone]   [facility] (RT[facility]) [address], [place], [place] [phone] Accepts Medicaid  [facility] [address]  [place] [phone] Substance Abuse/Addiction Treatment   [facility]         Address  Phone  Notes  [facility]  [phone]   Angie Fava, PhD [address] [place], [place]   [phone] or [phone]   [facility]   [address] [place], [place] [phone]   [facility] [address] [place], [place] [phone] Insurance/Medicaid/sponsorship through [facility] and Families [address]                                    [place], [place] [phone] Therapy/tele-psych/case  [facility] [address], [place] [phone]    Dr. Lolly Mustache  [phone]   [facility]  [employer] [facility] Dept. 1) [address], [place] 2) [address], [place] 3)  [address], [place] [phone]  [phone]   [facility] Child Abuse Hotline 803-013-0574 or [phone] (After Hours)

## 2013-11-06 NOTE — Progress Notes (Signed)
[facility] Community Liaison Stacy, ° °Provided pt with a list of self-pay providers to help patient establish primary care.  °

## 2013-11-06 NOTE — ED Notes (Signed)
Pt verbalizes she is leaving with ALL belongings she arrived with. 

## 2013-11-06 NOTE — ED Provider Notes (Signed)
CSN: [id number]     Arrival date & time 11/06/13  1340 History   First MD Initiated Contact with Patient 11/06/13 1450   This chart was scribed for non-physician practitioner Harle Battiest, NP, working with Raeford Razor, MD by Gwenevere Abbot, ED scribe. This patient was seen in room WTR8/WTR8 and the patient's care was started at 3:43 PM.   Chief Complaint  Patient presents with  . Cough  . Fever  . Chills  . Nasal Congestion   The history is provided by the patient. No language interpreter was used.   HPI Comments:  Julia Hall is a 23 y.o. female who presents to the Emergency Department complaining of an 8 out of 10 sore throat, with associated symptoms of rhinorrhea, 8 out of 10 bilateral otalgia, congestion, SOB, chills, fever of 99.4 at home onset 5 days ago, with a cough that began today. Pt denies sick contacts. Pt denies asthma.  History reviewed. No pertinent past medical history. History reviewed. No pertinent past surgical history. No family history on file. History  Substance Use Topics  . Smoking status: Never Smoker   . Smokeless tobacco: Never Used  . Alcohol Use: No   OB History   Grav Para Term Preterm Abortions TAB SAB Ect Mult Living                 Review of Systems  Constitutional: Positive for fever and chills.  HENT: Positive for congestion, ear pain, rhinorrhea, sneezing and sore throat.   Respiratory: Positive for cough.   Gastrointestinal: Negative for nausea, vomiting and diarrhea.    Allergies  Penicillins  Home Medications   Prior to Admission medications   Medication Sig Start Date End Date Taking? Authorizing Provider  cetirizine (ZYRTEC) 10 MG tablet Take 10 mg by mouth daily.   Yes Historical Provider, MD  norethindrone-ethinyl estradiol 1/35 (PIRMELLA 1/35) tablet Take 1 tablet by mouth daily.   Yes Historical Provider, MD  Nutritional Supplements (COLD AND FLU PO) Take 2 tablets by mouth every 4 (four) hours as needed (for  cold).   Yes Historical Provider, MD   BP 116/69  Pulse 66  Temp(Src) 98 F (36.7 C) (Oral)  Resp 20  SpO2 100%  LMP 09/05/2013 Physical Exam  Nursing note and vitals reviewed. Constitutional: She is oriented to person, place, and time. She appears well-developed and well-nourished.  HENT:  Head: Normocephalic and atraumatic.  Right Ear: Tympanic membrane normal.  Left Ear: Tympanic membrane is erythematous and bulging.  Mouth/Throat: Posterior oropharyngeal erythema present. No oropharyngeal exudate or tonsillar abscesses.  No maxiallry sinus tenderness. No lymph adenopathy.   Eyes: EOM are normal.  Neck: Normal range of motion. Neck supple.  Cardiovascular: Normal rate.   Pulmonary/Chest: Effort normal.  Musculoskeletal: Normal range of motion.  Neurological: She is alert and oriented to person, place, and time.  Skin: Skin is warm and dry.  Psychiatric: She has a normal mood and affect. Her behavior is normal.    ED Course  Procedures  DIAGNOSTIC STUDIES: Oxygen Saturation is 100% on RA, normal by my interpretation.  COORDINATION OF CARE: 3:51 PM-Discussed treatment plan which includes strep screen with pt at bedside and pt agreed to plan.  Labs Review Labs Reviewed  RAPID STREP SCREEN  CULTURE, GROUP A STREP  PREGNANCY, URINE   Imaging Review No results found.   EKG Interpretation None      MDM   Final diagnoses:  Acute ear infection, left   23 yo female  presents with erythematous sore throat and bilat ear pain.  No TMJ or dental pain. Strep screen negative. Exam consistent for left acute otitis media. No concern for acute mastoiditis, or meningitis.  Pt denies antibiotic use in the last month but does have a PCN allergy. Patient discharged home with Clindamycin. Discharge instructions include symptomatic management with tylenol or ibuprofen for pain, and follow-up with primary care physician. Patient verbalizes understanding and agrees with plan.  Return  precautions provided.   I personally performed the services described in this documentation, which was scribed in my presence. The recorded information has been reviewed and is accurate.  Filed Vitals:   11/06/13 1358 11/06/13 1643  BP: 116/69 108/69  Pulse: 66 63  Temp: 98 F (36.7 C)   TempSrc: Oral   Resp: 20 18  SpO2: 100% 100%   Meds given in ED:  Medications - No data to display  Discharge Medication List as of 11/06/2013  5:09 PM    START taking these medications   Details  clindamycin (CLEOCIN) 150 MG capsule Take 3 capsules (450 mg total) by mouth 3 (three) times daily. May dispense as  capsules, Starting 11/06/2013, Until Discontinued, Print         Harle Battiest, NP 11/08/13 2147

## 2013-11-08 LAB — CULTURE, GROUP A STREP

## 2013-11-09 NOTE — ED Provider Notes (Signed)
Medical screening examination/treatment/procedure(s) were performed by non-physician practitioner and as supervising physician I was immediately available for consultation/collaboration.   EKG Interpretation None       Raeford Razor, MD 11/09/13 [phone]

## 2014-02-13 ENCOUNTER — Emergency Department (HOSPITAL_COMMUNITY): Payer: BC Managed Care – PPO

## 2014-02-13 ENCOUNTER — Encounter (HOSPITAL_COMMUNITY): Payer: Self-pay | Admitting: Emergency Medicine

## 2014-02-13 ENCOUNTER — Emergency Department (HOSPITAL_COMMUNITY)
Admission: EM | Admit: 2014-02-13 | Discharge: 2014-02-14 | Disposition: A | Discharge status: Home / Self Care | Payer: BC Managed Care – PPO | Attending: Emergency Medicine | Admitting: Emergency Medicine

## 2014-02-13 DIAGNOSIS — Z79899 Other long term (current) drug therapy: Secondary | ICD-10-CM | POA: Insufficient documentation

## 2014-02-13 DIAGNOSIS — R300 Dysuria: Secondary | ICD-10-CM

## 2014-02-13 DIAGNOSIS — Z880 Allergy status to penicillin: Secondary | ICD-10-CM | POA: Diagnosis not present

## 2014-02-13 DIAGNOSIS — N858 Other specified noninflammatory disorders of uterus: Secondary | ICD-10-CM | POA: Insufficient documentation

## 2014-02-13 DIAGNOSIS — Z792 Long term (current) use of antibiotics: Secondary | ICD-10-CM | POA: Insufficient documentation

## 2014-02-13 DIAGNOSIS — N390 Urinary tract infection, site not specified: Secondary | ICD-10-CM | POA: Diagnosis present

## 2014-02-13 DIAGNOSIS — Z3202 Encounter for pregnancy test, result negative: Secondary | ICD-10-CM | POA: Diagnosis not present

## 2014-02-13 DIAGNOSIS — R188 Other ascites: Secondary | ICD-10-CM | POA: Diagnosis not present

## 2014-02-13 DIAGNOSIS — R102 Pelvic and perineal pain: Secondary | ICD-10-CM

## 2014-02-13 LAB — WET PREP, GENITAL
CLUE CELLS WET PREP: NONE SEEN
Trich, Wet Prep: NONE SEEN
YEAST WET PREP: NONE SEEN

## 2014-02-13 LAB — URINE MICROSCOPIC-ADD ON

## 2014-02-13 LAB — URINALYSIS, ROUTINE W REFLEX MICROSCOPIC
BILIRUBIN URINE: NEGATIVE
Glucose, UA: NEGATIVE mg/dL
Ketones, ur: NEGATIVE mg/dL
Leukocytes, UA: NEGATIVE
Nitrite: NEGATIVE
PH: 5 (ref 5.0–8.0)
Protein, ur: 100 mg/dL — AB
Specific Gravity, Urine: 1.03 (ref 1.005–1.030)
Urobilinogen, UA: 0.2 mg/dL (ref 0.0–1.0)

## 2014-02-13 LAB — PREGNANCY, URINE: PREG TEST UR: NEGATIVE

## 2014-02-13 NOTE — ED Notes (Signed)
Per pt, states pain with urination and ear ache since last Friday

## 2014-02-13 NOTE — ED Notes (Signed)
Ultrasound at bedside. Friend is sitting out side in the room.

## 2014-02-13 NOTE — ED Provider Notes (Signed)
CSN: [id number]     Arrival date & time 02/13/14  1835 History   This chart was scribed for non-physician practitioner, Julia BattiestElizabeth Tinita Brooker, NP working with Elwin MochaBlair Walden, MD, by Abel PrestoKara Demonbreun, ED Scribe. This patient was seen in room WTR7/WTR7 and the patient's care was started at 8:49 PM.       Chief Complaint  Patient presents with  . Urinary Tract Infection  . Otalgia    Patient is a 23 y.o. female presenting with urinary tract infection and ear pain. The history is provided by the patient. No language interpreter was used.  Urinary Tract Infection  Otalgia Associated symptoms: no congestion, no fever, no rhinorrhea and no vomiting     HPI Comments: Julia Hall is a 23 y.o. female who presents to the Emergency Department complaining of dysuria with onset 10 days ago. She was treated for a yeast infection and given one pill but her symptoms have continued.  Pt notes associated lower back pain, abdominal pressure and chills.  Pt has been taking bcp since around August, she does not know when her last Depo-Provera shot was prior to bcp start.  Pt notes her menstrual periods are irregular. She is not sure when her LMP was because of the depo shots.  Pt denies nausea, vomiting, fever, vaginal discharge, rhinorrhea, and congestion.   History reviewed. No pertinent past medical history. History reviewed. No pertinent past surgical history. No family history on file. History  Substance Use Topics  . Smoking status: Never Smoker   . Smokeless tobacco: Never Used  . Alcohol Use: No   OB History    No data available     Review of Systems  Constitutional: Positive for chills. Negative for fever.  HENT: Positive for ear pain. Negative for congestion and rhinorrhea.   Gastrointestinal: Negative for nausea and vomiting.  Genitourinary: Positive for dysuria. Negative for vaginal discharge.  Musculoskeletal: Positive for back pain.    Allergies  Penicillins  Home Medications    Prior to Admission medications   Medication Sig Start Date End Date Taking? Authorizing Provider  cetirizine (ZYRTEC) 10 MG tablet Take 10 mg by mouth daily.    Historical Provider, MD  clindamycin (CLEOCIN) 150 MG capsule Take 3 capsules (450 mg total) by mouth 3 (three) times daily. May dispense as 150mg  capsules 11/06/13   Julia BattiestElizabeth Wynee Matarazzo, NP  norethindrone-ethinyl estradiol 1/35 (PIRMELLA 1/35) tablet Take 1 tablet by mouth daily.    Historical Provider, MD  Nutritional Supplements (COLD AND FLU PO) Take 2 tablets by mouth every 4 (four) hours as needed (for cold).    Historical Provider, MD   BP 118/67 mmHg  Pulse 84  Temp(Src) 98.2 F (36.8 C) (Oral)  Resp 16  SpO2 100%  LMP 12/18/2013 Physical Exam  Constitutional: She is oriented to person, place, and time. She appears well-developed and well-nourished. No distress.  HENT:  Head: Normocephalic and atraumatic.  Right Ear: Tympanic membrane normal. No middle ear effusion.  Left Ear: Tympanic membrane normal.  No middle ear effusion.  Mouth/Throat: Oropharynx is clear and moist. No oropharyngeal exudate.  Eyes: Conjunctivae are normal.  Neck: Normal range of motion. Neck supple. No thyromegaly present.  Cardiovascular: Normal rate, regular rhythm and intact distal pulses.   Pulmonary/Chest: Effort normal and breath sounds normal. No respiratory distress. She has no wheezes. She has no rales. She exhibits no tenderness.  Abdominal: Soft. There is tenderness in the suprapubic area. There is CVA tenderness.    Genitourinary: There is  no tenderness on the right labia. There is no tenderness on the left labia. Cervix exhibits discharge. Cervix exhibits no motion tenderness. Right adnexum displays tenderness. Right adnexum displays no mass and no fullness. Left adnexum displays no mass, no tenderness and no fullness.  Musculoskeletal: Normal range of motion. She exhibits no tenderness.  Lymphadenopathy:    She has no cervical  adenopathy.  Neurological: She is alert and oriented to person, place, and time.  Skin: Skin is warm and dry. No rash noted. She is not diaphoretic.  Psychiatric: She has a normal mood and affect. Her behavior is normal.  Nursing note and vitals reviewed.   ED Course  Procedures (including critical care time) DIAGNOSTIC STUDIES: Oxygen Saturation is 100% on room air, normal by my interpretation.    COORDINATION OF CARE: 8:54 PM Discussed treatment plan with patient at beside, the patient agrees with the plan and has no further questions at this time.   Labs Review Labs Reviewed  WET PREP, GENITAL - Abnormal; Notable for the following:    WBC, Wet Prep HPF POC MODERATE (*)    All other components within normal limits  URINALYSIS, ROUTINE W REFLEX MICROSCOPIC - Abnormal; Notable for the following:    APPearance CLOUDY (*)    Hgb urine dipstick TRACE (*)    Protein, ur 100 (*)    All other components within normal limits  GC/CHLAMYDIA PROBE AMP  URINE MICROSCOPIC-ADD ON  PREGNANCY, URINE  POC URINE PREG, ED    Imaging Review US Transvaginal Non-ob  02/14/2014   CLINICAL DATA:  RIGHT adnexal pain.  EXAM: TRANSABDOMINAL AND TRANSVAGINAL ULTRASOUND OF PELVIS  TECHNIQUE: Both transabdominal and transvaginal ultrasound examinations of the pelvis were performed. Transabdominal technique was performed for global imaging of the pelvis including uterus, ovaries, adnexal regions, and pelvic cul-de-sac. It was necessary to proceed with endovaginal exam following the transabdominal exam to visualize the adnexa.  COMPARISON:  Pelvic ultrasound March 28, 2013  FINDINGS: Uterus  Measurements: 7.2 x 2.9 x 4.3 cm. No fibroids or other mass visualized.  Endometrium  Thickness: 7 mm.  No focal abnormality visualized.  Right ovary  Measurements: 3.2 x 2.1 x 2.9 cm. Normal appearance/no adnexal mass.  Left ovary  Measurements: 3.1 x 1.8 x 2.4 cm. Normal appearance/no adnexal mass.  Other findings  On  the moderate amount of free fluid in the pelvic cul-de-sac.  IMPRESSION: Small to moderate amount of free fluid in the pelvic cul-de-sac, somewhat greater than expected for physiologic free fluid. Otherwise normal pelvic ultrasound.   Electronically Signed   By: Awilda Metro   On: 02/14/2014 00:01   US Pelvis Complete  02/14/2014   CLINICAL DATA:  RIGHT adnexal pain.  EXAM: TRANSABDOMINAL AND TRANSVAGINAL ULTRASOUND OF PELVIS  TECHNIQUE: Both transabdominal and transvaginal ultrasound examinations of the pelvis were performed. Transabdominal technique was performed for global imaging of the pelvis including uterus, ovaries, adnexal regions, and pelvic cul-de-sac. It was necessary to proceed with endovaginal exam following the transabdominal exam to visualize the adnexa.  COMPARISON:  Pelvic ultrasound March 28, 2013  FINDINGS: Uterus  Measurements: 7.2 x 2.9 x 4.3 cm. No fibroids or other mass visualized.  Endometrium  Thickness: 7 mm.  No focal abnormality visualized.  Right ovary  Measurements: 3.2 x 2.1 x 2.9 cm. Normal appearance/no adnexal mass.  Left ovary  Measurements: 3.1 x 1.8 x 2.4 cm. Normal appearance/no adnexal mass.  Other findings  On the moderate amount of free fluid in the pelvic  cul-de-sac.  IMPRESSION: Small to moderate amount of free fluid in the pelvic cul-de-sac, somewhat greater than expected for physiologic free fluid. Otherwise normal pelvic ultrasound.   Electronically Signed   By: Awilda Metroourtnay  Bloomer   On: 02/14/2014 00:01   Ct Renal Stone Study  02/14/2014   CLINICAL DATA:  Pelvic fluid collection.  Low back pain.  EXAM: CT ABDOMEN AND PELVIS WITHOUT CONTRAST  TECHNIQUE: Multidetector CT imaging of the abdomen and pelvis was performed following the standard protocol without IV contrast.  COMPARISON:  09/06/2013  FINDINGS: BODY WALL: Unremarkable.  LOWER CHEST: Unremarkable.  ABDOMEN/PELVIS:  Liver: Visible portions negative.  Biliary: No evidence of biliary obstruction or  stone.  Pancreas: Unremarkable.  Spleen: Unremarkable.  Adrenals: Unremarkable.  Kidneys and ureters: No hydronephrosis or stone.  Bladder: Unremarkable.  Reproductive: Unremarkable.  Bowel: No obstruction. The appendix is difficult to visualize in its entirety. No evidence of appendicitis.  Retroperitoneum: No mass or adenopathy.  Peritoneum: Trace pelvic fluid, likely physiologic.  Vascular: No acute abnormality.  OSSEOUS: No acute abnormalities.  IMPRESSION: 1. No acute intra-abdominal findings. 2. Trace free pelvic fluid.   Electronically Signed   By: Tiburcio PeaJonathan  Watts M.D.   On: 02/14/2014 01:31     EKG Interpretation None      MDM   Final diagnoses:  Adnexal tenderness, right  Dysuria  Pelvic fluid collection   23 yo with dysuria x 10 days, Urinalysis negative for leukocytes or nitrites. Discussed case with Dr. Gwendolyn GrantWalden. Pelvic exam negative for trich, yeast and clue cells but with moderate WBC and right adnexal tenderness.  Pelvic US done with no TOA but small to moderate amount of free fluid in pelvic cul de sac. Non contrast abd CT done with no acute abnormality noted. Pt treated empirically for STI and pyridium for dysuria. [facility]  Pt is well-appearing, in no acute distress and vital signs are stable.  They appear safe to be discharged. Return precautions provided.    I personally performed the services described in this documentation, which was scribed in my presence. The recorded information has been reviewed and is accurate.  Filed Vitals:   02/13/14 1848 02/14/14 0015  BP: 118/67 103/54  Pulse: 84 73  Temp: 98.2 F (36.8 C) 98.9 F (37.2 C)  TempSrc: Oral Oral  Resp: 16 20  SpO2: 100% 100%   Meds given in ED:  Medications  doxycycline (VIBRA-TABS) tablet 100 mg (100 mg Oral Given 02/14/14 0041)  cefTRIAXone (ROCEPHIN) injection 250 mg (250 mg Intramuscular Given 02/14/14 0042)  sterile water (preservative free) injection (2.1 mLs   Given 02/14/14 0045)  phenazopyridine (PYRIDIUM) tablet 200 mg (200 mg Oral Given 02/14/14 0113)    Discharge Medication List as of 02/14/2014 12:46 AM    START taking these medications   Details  doxycycline (VIBRAMYCIN) 100 MG capsule Take 1 capsule (100 mg total) by mouth 2 (two) times daily., Starting 02/14/2014, Until Discontinued, Print    phenazopyridine (PYRIDIUM) 200 MG tablet Take 1 tablet (200 mg total) by mouth 3 (three) times daily., Starting 02/14/2014, Until Discontinued, Print           Julia BattiestElizabeth Nimrod Wendt, NP 02/14/14 1703  Elwin MochaBlair Walden, MD 02/14/14 [phone]

## 2014-02-13 NOTE — ED Notes (Signed)
Also, has mid back pain that does not radiate. Tenderness to mid back.

## 2014-02-14 ENCOUNTER — Emergency Department (HOSPITAL_COMMUNITY): Payer: BC Managed Care – PPO

## 2014-02-14 LAB — GC/CHLAMYDIA PROBE AMP
CT Probe RNA: NEGATIVE
GC PROBE AMP APTIMA: NEGATIVE

## 2014-02-14 MED ORDER — DOXYCYCLINE HYCLATE 100 MG PO CAPS: 100.0000 mg | ORAL_CAPSULE | Freq: Two times a day (BID) | ORAL | Status: DC

## 2014-02-14 MED ORDER — STERILE WATER FOR INJECTION IJ SOLN
INTRAMUSCULAR | Status: AC
  Administered 2014-02-14: 2.1 mL
  Filled 2014-02-14: qty 10

## 2014-02-14 MED ORDER — CEFTRIAXONE SODIUM 250 MG IJ SOLR
250.0000 mg | Freq: Once | INTRAMUSCULAR | Status: AC
  Administered 2014-02-14: 250 mg via INTRAMUSCULAR
  Filled 2014-02-14: qty 250

## 2014-02-14 MED ORDER — DOXYCYCLINE HYCLATE 100 MG PO TABS
100.0000 mg | ORAL_TABLET | Freq: Once | ORAL | Status: AC
  Administered 2014-02-14: 100 mg via ORAL
  Filled 2014-02-14: qty 1

## 2014-02-14 MED ORDER — PHENAZOPYRIDINE HCL 200 MG PO TABS
200.0000 mg | ORAL_TABLET | ORAL | Status: AC
  Administered 2014-02-14: 200 mg via ORAL
  Filled 2014-02-14: qty 1

## 2014-02-14 MED ORDER — PHENAZOPYRIDINE HCL 200 MG PO TABS: 200.0000 mg | ORAL_TABLET | Freq: Three times a day (TID) | ORAL | Status: DC

## 2014-02-14 NOTE — ED Notes (Signed)
Patient using cell phone while at the bedside.

## 2014-02-14 NOTE — Discharge Instructions (Signed)
Please follow the directions provided.  Be sure to [facility] with [facility] this [facility] to ensure you are getting better.  Take your antibiotic as directed and the pyridium for pain with urination.     SEEK IMMEDIATE MEDICAL CARE IF:  You have heavy bleeding from the vagina.  Your pelvic pain increases.  You feel light-headed or faint.  You have chills.  You have pain with urination or blood in your urine.  You have uncontrolled diarrhea or vomiting.  You have a fever or persistent symptoms for more than 3 days.  You have a fever and your symptoms suddenly get worse.  You are being physically or sexually abused.

## 2014-02-14 NOTE — ED Notes (Signed)
Provided patient Julia Hall, peanut butter, and apple juice.

## 2014-02-26 ENCOUNTER — Encounter (HOSPITAL_COMMUNITY): Payer: Self-pay

## 2014-02-26 ENCOUNTER — Emergency Department (HOSPITAL_COMMUNITY)
Admission: EM | Admit: 2014-02-26 | Discharge: 2014-02-27 | Disposition: A | Discharge status: Home / Self Care | Payer: Self-pay | Attending: Emergency Medicine | Admitting: Emergency Medicine

## 2014-02-26 DIAGNOSIS — R109 Unspecified abdominal pain: Secondary | ICD-10-CM

## 2014-02-26 DIAGNOSIS — Z8709 Personal history of other diseases of the respiratory system: Secondary | ICD-10-CM | POA: Insufficient documentation

## 2014-02-26 DIAGNOSIS — Z3202 Encounter for pregnancy test, result negative: Secondary | ICD-10-CM | POA: Insufficient documentation

## 2014-02-26 DIAGNOSIS — R110 Nausea: Secondary | ICD-10-CM | POA: Insufficient documentation

## 2014-02-26 DIAGNOSIS — R1031 Right lower quadrant pain: Secondary | ICD-10-CM | POA: Insufficient documentation

## 2014-02-26 DIAGNOSIS — Z880 Allergy status to penicillin: Secondary | ICD-10-CM | POA: Insufficient documentation

## 2014-02-26 DIAGNOSIS — Z79899 Other long term (current) drug therapy: Secondary | ICD-10-CM | POA: Insufficient documentation

## 2014-02-26 HISTORY — DX: Other seasonal allergic rhinitis: J30.2

## 2014-02-26 LAB — LIPASE, BLOOD: Lipase: 29 U/L (ref 11–59)

## 2014-02-26 LAB — CBC WITH DIFFERENTIAL/PLATELET
Basophils Absolute: 0 10*3/uL (ref 0.0–0.1)
Basophils Relative: 0 % (ref 0–1)
EOS PCT: 1 % (ref 0–5)
Eosinophils Absolute: 0.1 10*3/uL (ref 0.0–0.7)
HEMATOCRIT: 40.7 % (ref 36.0–46.0)
HEMOGLOBIN: 13.3 g/dL (ref 12.0–15.0)
Lymphocytes Relative: 29 % (ref 12–46)
Lymphs Abs: 2.2 10*3/uL (ref 0.7–4.0)
MCH: 27.9 pg (ref 26.0–34.0)
MCHC: 32.7 g/dL (ref 30.0–36.0)
MCV: 85.5 fL (ref 78.0–100.0)
MONOS PCT: 7 % (ref 3–12)
Monocytes Absolute: 0.5 10*3/uL (ref 0.1–1.0)
NEUTROS ABS: 4.7 10*3/uL (ref 1.7–7.7)
Neutrophils Relative %: 63 % (ref 43–77)
Platelets: 205 10*3/uL (ref 150–400)
RBC: 4.76 MIL/uL (ref 3.87–5.11)
RDW: 13.2 % (ref 11.5–15.5)
WBC: 7.4 10*3/uL (ref 4.0–10.5)

## 2014-02-26 LAB — URINALYSIS, ROUTINE W REFLEX MICROSCOPIC
Bilirubin Urine: NEGATIVE
Glucose, UA: NEGATIVE mg/dL
Hgb urine dipstick: NEGATIVE
Ketones, ur: NEGATIVE mg/dL
Leukocytes, UA: NEGATIVE
Nitrite: NEGATIVE
PROTEIN: NEGATIVE mg/dL
SPECIFIC GRAVITY, URINE: 1.012 (ref 1.005–1.030)
Urobilinogen, UA: 0.2 mg/dL (ref 0.0–1.0)
pH: 7.5 (ref 5.0–8.0)

## 2014-02-26 LAB — COMPREHENSIVE METABOLIC PANEL
ALBUMIN: 3.9 g/dL (ref 3.5–5.2)
ALT: 12 U/L (ref 0–35)
AST: 16 U/L (ref 0–37)
Alkaline Phosphatase: 55 U/L (ref 39–117)
Anion gap: 7 (ref 5–15)
BUN: 8 mg/dL (ref 6–23)
CHLORIDE: 100 meq/L (ref 96–112)
CO2: 27 mmol/L (ref 19–32)
Calcium: 8.7 mg/dL (ref 8.4–10.5)
Creatinine, Ser: 0.7 mg/dL (ref 0.50–1.10)
GFR calc Af Amer: >90 mL/min (ref >90)
GFR calc non Af Amer: >90 mL/min (ref >90)
GLUCOSE: 92 mg/dL (ref 70–99)
POTASSIUM: 3.7 mmol/L (ref 3.5–5.1)
SODIUM: 134 mmol/L — AB (ref 135–145)
TOTAL PROTEIN: 7 g/dL (ref 6.0–8.3)
Total Bilirubin: 0.4 mg/dL (ref 0.3–1.2)

## 2014-02-26 LAB — POC URINE PREG, ED: PREG TEST UR: NEGATIVE

## 2014-02-26 NOTE — ED Provider Notes (Signed)
CSN: [id number]     Arrival date & time 02/26/14  1909 History   First MD Initiated Contact with Patient 02/26/14 2239     Chief Complaint  Patient presents with  . Abdominal Pain   (Consider location/radiation/quality/duration/timing/severity/associated sxs/prior Treatment) HPI Julia Hall is a 24 yo female presenting with RLQ pain x 4 days.  She reports the pain has been intermittent x 4 days but is worse with walking or lying on the right side.  She rates the pain as 7/10.  She also notes she has had some nausea and feeling more tired.  Her LMP was 1 week ago.  Her last bm was today.  She denies any fevers, chills, vomiting, diarrhea, vaginal discharge or dysuria.  Past Medical History  Diagnosis Date  . Seasonal allergies    History reviewed. No pertinent past surgical history. History reviewed. No pertinent family history. History  Substance Use Topics  . Smoking status: Never Smoker   . Smokeless tobacco: Never Used  . Alcohol Use: No   OB History    No data available     Review of Systems  Constitutional: Negative for fever and chills.  HENT: Negative for sore throat.   Eyes: Negative for visual disturbance.  Respiratory: Negative for cough and shortness of breath.   Cardiovascular: Negative for chest pain and leg swelling.  Gastrointestinal: Positive for nausea and abdominal pain. Negative for vomiting and diarrhea.  Genitourinary: Negative for dysuria and vaginal discharge.  Musculoskeletal: Negative for myalgias.  Skin: Negative for rash.  Neurological: Negative for weakness, numbness and headaches.      Allergies  Penicillins  Home Medications   Prior to Admission medications   Medication Sig Start Date End Date Taking? Authorizing Provider  cetirizine (ZYRTEC) 10 MG tablet Take 10 mg by mouth daily as needed for allergies.    Yes Historical Provider, MD  triamcinolone cream (KENALOG) 0.1 % Apply 1 application topically 2 (two) times daily as needed.  For eczema. 08/09/13  Yes Historical Provider, MD  clindamycin (CLEOCIN) 150 MG capsule Take 3 capsules (450 mg total) by mouth 3 (three) times daily. May dispense as  capsules Patient not taking: Reported on 02/13/2014 11/06/13   Harle Battiest, NP  doxycycline (VIBRAMYCIN) 100 MG capsule Take 1 capsule (100 mg total) by mouth 2 (two) times daily. Patient not taking: Reported on 02/26/2014 02/14/14   Harle Battiest, NP  phenazopyridine (PYRIDIUM) 200 MG tablet Take 1 tablet (200 mg total) by mouth 3 (three) times daily. Patient not taking: Reported on 02/26/2014 02/14/14   Harle Battiest, NP   BP 114/70 mmHg  Pulse 92  Temp(Src) 98.2 F (36.8 C) (Oral)  Resp 18  Ht  (1.676 m)  Wt 189 lb (85.73 kg)  BMI 30.52 kg/m2  SpO2 100%  LMP 02/13/2014 Physical Exam  Constitutional: She appears well-developed and well-nourished. No distress.  HENT:  Head: Normocephalic and atraumatic.  Mouth/Throat: Oropharynx is clear and moist. No oropharyngeal exudate.  Eyes: Conjunctivae are normal.  Neck: Neck supple. No thyromegaly present.  Cardiovascular: Normal rate, regular rhythm and intact distal pulses.   Pulmonary/Chest: Effort normal and breath sounds normal. No respiratory distress. She has no wheezes. She has no rales. She exhibits no tenderness.  Abdominal: Soft. Bowel sounds are normal. She exhibits no distension and no mass. There is tenderness in the right lower quadrant. There is no rigidity, no rebound, no guarding, no CVA tenderness, no tenderness at McBurney's point and negative Murphy's sign.  Musculoskeletal: She exhibits no tenderness.  Lymphadenopathy:    She has no cervical adenopathy.       Right: Inguinal adenopathy present.  Neurological: She is alert.  Skin: Skin is warm and dry. No rash noted. She is not diaphoretic.  Psychiatric: She has a normal mood and affect.  Nursing note and vitals reviewed.   ED Course  Procedures (including critical care  time) Labs Review Labs Reviewed  COMPREHENSIVE METABOLIC PANEL - Abnormal; Notable for the following:    Sodium 134 (*)    All other components within normal limits  CBC WITH DIFFERENTIAL  LIPASE, BLOOD  URINALYSIS, ROUTINE W REFLEX MICROSCOPIC  POC URINE PREG, ED    Imaging Review No results found.   EKG Interpretation None      MDM   Final diagnoses:  None   [age] yo female with 4 day hx of RLQ pain and associated nausea. Consider ovarian etiology vs appendicitis. Protocol CBC, BMP, Lipase, UA and Upreg all without significant abnormality.  On pelvic exam, pain seems to be more pelvic in nature with right inguinal lymphadenopathy noted.  Case discussed with Dr. Gwendolyn GrantWalden. Pelvic ultrasound ordered.  At end of shift, hand-off report given to TRW AutomotiveKelly Humes, PA-C.  Plan includes interpret US results and if negative will CT for evaluation of appendix.     Filed Vitals:   02/26/14 1919  BP: 114/70  Pulse: 92  Temp: 98.2 F (36.8 C)  TempSrc: Oral  Resp: 18  Height: 5\' 6"  (1.676 m)  Weight: 189 lb (85.73 kg)  SpO2: 100%   Meds given in ED:  Medications - No data to display  New Prescriptions   No medications on file       Harle BattiestElizabeth Camera Krienke, NP 02/28/14 0010  Elwin MochaBlair Walden, MD 02/28/14 [phone]

## 2014-02-26 NOTE — ED Notes (Signed)
RLQ abdominal pain x4 days.  Also reports fatigue and nausea.  Denies vomiting or diarrhea.

## 2014-02-27 ENCOUNTER — Emergency Department (HOSPITAL_COMMUNITY): Payer: Self-pay

## 2014-02-27 ENCOUNTER — Emergency Department (HOSPITAL_COMMUNITY): Payer: PRIVATE HEALTH INSURANCE

## 2014-02-27 ENCOUNTER — Encounter (HOSPITAL_COMMUNITY): Payer: Self-pay

## 2014-02-27 LAB — WET PREP, GENITAL
CLUE CELLS WET PREP: NONE SEEN
Trich, Wet Prep: NONE SEEN
YEAST WET PREP: NONE SEEN

## 2014-02-27 LAB — GC/CHLAMYDIA PROBE AMP
CT Probe RNA: NEGATIVE
GC Probe RNA: NEGATIVE

## 2014-02-27 MED ORDER — IOHEXOL 300 MG/ML  SOLN
50.0000 mL | Freq: Once | INTRAMUSCULAR | Status: AC | PRN
  Administered 2014-02-27: 50 mL via ORAL

## 2014-02-27 MED ORDER — SODIUM CHLORIDE 0.9 % IV BOLUS (SEPSIS)
1000.0000 mL | INTRAVENOUS | Status: AC
  Administered 2014-02-27: 1000 mL via INTRAVENOUS

## 2014-02-27 MED ORDER — ONDANSETRON HCL 4 MG/2ML IJ SOLN
4.0000 mg | Freq: Once | INTRAMUSCULAR | Status: AC
  Administered 2014-02-27: 4 mg via INTRAVENOUS
  Filled 2014-02-27: qty 2

## 2014-02-27 MED ORDER — NAPROXEN 500 MG PO TABS: 500.0000 mg | ORAL_TABLET | Freq: Two times a day (BID) | ORAL | Status: DC

## 2014-02-27 MED ORDER — KETOROLAC TROMETHAMINE 30 MG/ML IJ SOLN
30.0000 mg | Freq: Once | INTRAMUSCULAR | Status: AC
  Administered 2014-02-27: 30 mg via INTRAVENOUS
  Filled 2014-02-27: qty 1

## 2014-02-27 MED ORDER — MORPHINE SULFATE 4 MG/ML IJ SOLN
4.0000 mg | Freq: Once | INTRAMUSCULAR | Status: DC
  Filled 2014-02-27: qty 1

## 2014-02-27 MED ORDER — IOHEXOL 300 MG/ML  SOLN
100.0000 mL | Freq: Once | INTRAMUSCULAR | Status: AC | PRN
  Administered 2014-02-27: 100 mL via INTRAVENOUS

## 2014-02-27 NOTE — Discharge Instructions (Signed)
Follow up with your primary care doctor and/or your OBGYN for further evaluation of symptoms. Take Naproxen for pain control as needed. Return to the ED if you develop fever, uncontrolled vomiting, or any of the symptoms listed below.  Abdominal Pain, Women Abdominal (stomach, pelvic, or belly) pain can be caused by many things. It is important to tell your doctor:  The location of the pain.  Does it come and go or is it present all the time?  Are there things that start the pain (eating certain foods, exercise)?  Are there other symptoms associated with the pain (fever, nausea, vomiting, diarrhea)? All of this is helpful to know when trying to find the cause of the pain. CAUSES   Stomach: virus or bacteria infection, or ulcer.  Intestine: appendicitis (inflamed appendix), regional ileitis (Crohn's disease), ulcerative colitis (inflamed colon), irritable bowel syndrome, diverticulitis (inflamed diverticulum of the colon), or cancer of the stomach or intestine.  Gallbladder disease or stones in the gallbladder.  Kidney disease, kidney stones, or infection.  Pancreas infection or cancer.  Fibromyalgia (pain disorder).  Diseases of the female organs:  Uterus: fibroid (non-cancerous) tumors or infection.  Fallopian tubes: infection or tubal pregnancy.  Ovary: cysts or tumors.  Pelvic adhesions (scar tissue).  Endometriosis (uterus lining tissue growing in the pelvis and on the pelvic organs).  Pelvic congestion syndrome (female organs filling up with blood just before the menstrual period).  Pain with the menstrual period.  Pain with ovulation (producing an egg).  Pain with an IUD (intrauterine device, birth control) in the uterus.  Cancer of the female organs.  Functional pain (pain not caused by a disease, may improve without treatment).  Psychological pain.  Depression. DIAGNOSIS  Your doctor will decide the seriousness of your pain by doing an  examination.  Blood tests.  X-rays.  Ultrasound.  CT scan (computed tomography, special type of X-ray).  MRI (magnetic resonance imaging).  Cultures, for infection.  Barium enema (dye inserted in the large intestine, to better view it with X-rays).  Colonoscopy (looking in intestine with a lighted tube).  Laparoscopy (minor surgery, looking in abdomen with a lighted tube).  Major abdominal exploratory surgery (looking in abdomen with a large incision). TREATMENT  The treatment will depend on the cause of the pain.   Many cases can be observed and treated at home.  Over-the-counter medicines recommended by your caregiver.  Prescription medicine.  Antibiotics, for infection.  Birth control pills, for painful periods or for ovulation pain.  Hormone treatment, for endometriosis.  Nerve blocking injections.  Physical therapy.  Antidepressants.  Counseling with a psychologist or psychiatrist.  Minor or major surgery. HOME CARE INSTRUCTIONS   Do not take laxatives, unless directed by your caregiver.  Take over-the-counter pain medicine only if ordered by your caregiver. Do not take aspirin because it can cause an upset stomach or bleeding.  Try a clear liquid diet (broth or water) as ordered by your caregiver. Slowly move to a bland diet, as tolerated, if the pain is related to the stomach or intestine.  Have a thermometer and take your temperature several times a day, and record it.  Bed rest and sleep, if it helps the pain.  Avoid sexual intercourse, if it causes pain.  Avoid stressful situations.  Keep your follow-up appointments and tests, as your caregiver orders.  If the pain does not go away with medicine or surgery, you may try:  Acupuncture.  Relaxation exercises (yoga, meditation).  Group therapy.  Counseling.  SEEK MEDICAL CARE IF:   You notice certain foods cause stomach pain.  Your home care treatment is not helping your pain.  You  need stronger pain medicine.  You want your IUD removed.  You feel faint or lightheaded.  You develop nausea and vomiting.  You develop a rash.  You are having side effects or an allergy to your medicine. SEEK IMMEDIATE MEDICAL CARE IF:   Your pain does not go away or gets worse.  You have a fever.  Your pain is felt only in portions of the abdomen. The right side could possibly be appendicitis. The left lower portion of the abdomen could be colitis or diverticulitis.  You are passing blood in your stools (bright red or black tarry stools, with or without vomiting).  You have blood in your urine.  You develop chills, with or without a fever.  You pass out. MAKE SURE YOU:   Understand these instructions.  Will watch your condition.  Will get help right away if you are not doing well or get worse. Document Released: 11/30/2006 Document Revised: 06/19/2013 Document Reviewed: 12/20/2008 [facility] Patient Information 2015 [place], [place]. This information is not intended to replace advice given to you by your health care provider. Make sure you discuss any questions you have with your health care provider.

## 2014-02-27 NOTE — ED Provider Notes (Signed)
Julia Hall - Patient care assumed from Julia BattiestElizabeth Tysinger, NP at shift change. Patient presenting for RLQ pain x 4 days. Patient was previously seen and evaluated for similar pain on 02/13/2014. She had a negative workup at this time. At shift change, ultrasound pending. Plan discussed with Tysinger, NP who recommends CT for further evaluation if ultrasound imaging negative.  Ultrasound today is without acute findings. CT ordered for further evaluation of symptoms. This shows mild circumferential bladder wall thickening. Urinalysis, however, does not suggest infection. There is also evidence of bilateral inguinal adenopathy, stable since 02/14/2014.  Patient's workup reviewed. She does have many white blood cells on her wet prep, but was treated for PID at her prior visit with doxycycline and Rocephin. She states she has only been sexually active with the same partner since her prior visit and he tested negative for STDs; patient herself was negative for GC/Chlamydia on 02/13/14. She denies being sexually active with any additional partners. No indication for subsequent PID tx at this time. Will provide Rx for Naproxen for pain control PRN. Patient advised to f/u with her PCP and/or OBGYN for recheck of symptoms. Return precautions provided and patient agreeable to plan with no unaddressed concerns. Patient discharged in good condition; VSS.   Filed Vitals:   02/26/14 1919 02/27/14 0003  BP: 114/70 122/65  Pulse: 92 72  Temp: 98.2 F (36.8 C)   TempSrc: Oral   Resp: 18 16  Height: 5\' 6"  (1.676 m)   Weight: 189 lb (85.73 kg)   SpO2: 100% 100%    Results for orders placed or performed during the hospital encounter of 02/26/14  Wet prep, genital  Result Value Ref Range   Yeast Wet Prep HPF POC NONE SEEN NONE SEEN   Trich, Wet Prep NONE SEEN NONE SEEN   Clue Cells Wet Prep HPF POC NONE SEEN NONE SEEN   WBC, Wet Prep HPF POC MANY (A) NONE SEEN  CBC with Differential  Result Value Ref Range   WBC  7.4 4.0 - 10.5 K/uL   RBC 4.76 3.87 - 5.11 MIL/uL   Hemoglobin 13.3 12.0 - 15.0 g/dL   HCT 16.140.7 09.636.0 - 04.546.0 %   MCV 85.5 78.0 - 100.0 fL   MCH 27.9 26.0 - 34.0 pg   MCHC 32.7 30.0 - 36.0 g/dL   RDW 40.913.2 81.111.5 - 91.415.5 %   Platelets 205 150 - 400 K/uL   Neutrophils Relative % 63 43 - 77 %   Neutro Abs 4.7 1.7 - 7.7 K/uL   Lymphocytes Relative 29 12 - 46 %   Lymphs Abs 2.2 0.7 - 4.0 K/uL   Monocytes Relative 7 3 - 12 %   Monocytes Absolute 0.5 0.1 - 1.0 K/uL   Eosinophils Relative 1 0 - 5 %   Eosinophils Absolute 0.1 0.0 - 0.7 K/uL   Basophils Relative 0 0 - 1 %   Basophils Absolute 0.0 0.0 - 0.1 K/uL  Comprehensive metabolic panel  Result Value Ref Range   Sodium 134 (L) 135 - 145 mmol/L   Potassium 3.7 3.5 - 5.1 mmol/L   Chloride 100 96 - 112 mEq/L   CO2 27 19 - 32 mmol/L   Glucose, Bld 92 70 - 99 mg/dL   BUN 8 6 - 23 mg/dL   Creatinine, Ser 7.820.70 0.50 - 1.10 mg/dL   Calcium 8.7 8.4 - 95.610.5 mg/dL   Total Protein 7.0 6.0 - 8.3 g/dL   Albumin 3.9 3.5 - 5.2 g/dL   AST  16 0 - 37 U/L   ALT 12 0 - 35 U/L   Alkaline Phosphatase 55 39 - 117 U/L   Total Bilirubin 0.4 0.3 - 1.2 mg/dL   GFR calc non Af Amer >90 >90 mL/min   GFR calc Af Amer >90 >90 mL/min   Anion gap 7 5 - 15  Lipase, blood  Result Value Ref Range   Lipase 29 11 - 59 U/L  Urinalysis, Routine w reflex microscopic  Result Value Ref Range   Color, Urine YELLOW YELLOW   APPearance CLEAR CLEAR   Specific Gravity, Urine 1.012 1.005 - 1.030   pH 7.5 5.0 - 8.0   Glucose, UA NEGATIVE NEGATIVE mg/dL   Hgb urine dipstick NEGATIVE NEGATIVE   Bilirubin Urine NEGATIVE NEGATIVE   Ketones, ur NEGATIVE NEGATIVE mg/dL   Protein, ur NEGATIVE NEGATIVE mg/dL   Urobilinogen, UA 0.2 0.0 - 1.0 mg/dL   Nitrite NEGATIVE NEGATIVE   Leukocytes, UA NEGATIVE NEGATIVE  POC Urine Pregnancy, ED  (If Pre-menopausal female) - do not order at [facility]  Result Value Ref Range   Preg Test, Ur NEGATIVE NEGATIVE   US Transvaginal Non-ob  02/27/2014    CLINICAL DATA:  Subacute onset right lower quadrant abdominal pain for 2 weeks. Initial encounter.  EXAM: TRANSABDOMINAL AND TRANSVAGINAL ULTRASOUND OF PELVIS  DOPPLER ULTRASOUND OF OVARIES  TECHNIQUE: Both transabdominal and transvaginal ultrasound examinations of the pelvis were performed. Transabdominal technique was performed for global imaging of the pelvis including uterus, ovaries, adnexal regions, and pelvic cul-de-sac.  It was necessary to proceed with endovaginal exam following the transabdominal exam to visualize the uterus and ovaries in greater detail. Color and duplex Doppler ultrasound was utilized to evaluate blood flow to the ovaries.  COMPARISON:  CT of the abdomen and pelvis from 02/14/2014, and pelvic ultrasound performed 02/13/2014  FINDINGS: Uterus  Measurements: 8.0 x 2.9 x 4.1 cm. No fibroids or other mass visualized.  Endometrium  Thickness: 1.1 mm.  No focal abnormality visualized.  Right ovary  Measurements: 3.5 x 2.5 x 2.8 cm. Normal appearance/no adnexal mass.  Left ovary  Measurements: 3.4 x 2.2 x 3.3 cm. Normal appearance/no adnexal mass.  Pulsed Doppler evaluation of both ovaries demonstrates normal low-resistance arterial and venous waveforms.  Other findings  A small amount of free fluid is seen within the pelvic cul-de-sac.  IMPRESSION: Unremarkable pelvic ultrasound.  No evidence for ovarian torsion.   Electronically Signed   By: Roanna Raider M.D.   On: 02/27/2014 02:06   US Transvaginal Non-ob  02/14/2014   CLINICAL DATA:  RIGHT adnexal pain.  EXAM: TRANSABDOMINAL AND TRANSVAGINAL ULTRASOUND OF PELVIS  TECHNIQUE: Both transabdominal and transvaginal ultrasound examinations of the pelvis were performed. Transabdominal technique was performed for global imaging of the pelvis including uterus, ovaries, adnexal regions, and pelvic cul-de-sac. It was necessary to proceed with endovaginal exam following the transabdominal exam to visualize the adnexa.  COMPARISON:  Pelvic  ultrasound March 28, 2013  FINDINGS: Uterus  Measurements: 7.2 x 2.9 x 4.3 cm. No fibroids or other mass visualized.  Endometrium  Thickness: 7 mm.  No focal abnormality visualized.  Right ovary  Measurements: 3.2 x 2.1 x 2.9 cm. Normal appearance/no adnexal mass.  Left ovary  Measurements: 3.1 x 1.8 x 2.4 cm. Normal appearance/no adnexal mass.  Other findings  On the moderate amount of free fluid in the pelvic cul-de-sac.  IMPRESSION: Small to moderate amount of free fluid in the pelvic cul-de-sac, somewhat greater than expected for physiologic  free fluid. Otherwise normal pelvic ultrasound.   Electronically Signed   By: Awilda Metro   On: 02/14/2014 00:01   US Pelvis Complete  02/27/2014   CLINICAL DATA:  Subacute onset right lower quadrant abdominal pain for 2 weeks. Initial encounter.  EXAM: TRANSABDOMINAL AND TRANSVAGINAL ULTRASOUND OF PELVIS  DOPPLER ULTRASOUND OF OVARIES  TECHNIQUE: Both transabdominal and transvaginal ultrasound examinations of the pelvis were performed. Transabdominal technique was performed for global imaging of the pelvis including uterus, ovaries, adnexal regions, and pelvic cul-de-sac.  It was necessary to proceed with endovaginal exam following the transabdominal exam to visualize the uterus and ovaries in greater detail. Color and duplex Doppler ultrasound was utilized to evaluate blood flow to the ovaries.  COMPARISON:  CT of the abdomen and pelvis from 02/14/2014, and pelvic ultrasound performed 02/13/2014  FINDINGS: Uterus  Measurements: 8.0 x 2.9 x 4.1 cm. No fibroids or other mass visualized.  Endometrium  Thickness: 1.1 mm.  No focal abnormality visualized.  Right ovary  Measurements: 3.5 x 2.5 x 2.8 cm. Normal appearance/no adnexal mass.  Left ovary  Measurements: 3.4 x 2.2 x 3.3 cm. Normal appearance/no adnexal mass.  Pulsed Doppler evaluation of both ovaries demonstrates normal low-resistance arterial and venous waveforms.  Other findings  A small amount of free  fluid is seen within the pelvic cul-de-sac.  IMPRESSION: Unremarkable pelvic ultrasound.  No evidence for ovarian torsion.   Electronically Signed   By: Roanna Raider M.D.   On: 02/27/2014 02:06   US Pelvis Complete  02/14/2014   CLINICAL DATA:  RIGHT adnexal pain.  EXAM: TRANSABDOMINAL AND TRANSVAGINAL ULTRASOUND OF PELVIS  TECHNIQUE: Both transabdominal and transvaginal ultrasound examinations of the pelvis were performed. Transabdominal technique was performed for global imaging of the pelvis including uterus, ovaries, adnexal regions, and pelvic cul-de-sac. It was necessary to proceed with endovaginal exam following the transabdominal exam to visualize the adnexa.  COMPARISON:  Pelvic ultrasound March 28, 2013  FINDINGS: Uterus  Measurements: 7.2 x 2.9 x 4.3 cm. No fibroids or other mass visualized.  Endometrium  Thickness: 7 mm.  No focal abnormality visualized.  Right ovary  Measurements: 3.2 x 2.1 x 2.9 cm. Normal appearance/no adnexal mass.  Left ovary  Measurements: 3.1 x 1.8 x 2.4 cm. Normal appearance/no adnexal mass.  Other findings  On the moderate amount of free fluid in the pelvic cul-de-sac.  IMPRESSION: Small to moderate amount of free fluid in the pelvic cul-de-sac, somewhat greater than expected for physiologic free fluid. Otherwise normal pelvic ultrasound.   Electronically Signed   By: Awilda Metro   On: 02/14/2014 00:01   Ct Abdomen Pelvis W Contrast  02/27/2014   CLINICAL DATA:  Initial evaluation for 4 day history of right lower quadrant pain. Fatigue.  EXAM: CT ABDOMEN AND PELVIS WITH CONTRAST  TECHNIQUE: Multidetector CT imaging of the abdomen and pelvis was performed using the standard protocol following bolus administration of intravenous contrast.  CONTRAST:  OMNIPAQUE IOHEXOL 300 MG/ML  SOLN  COMPARISON:  Prior CT from 02/14/2014.  FINDINGS: Small layering bilateral pleural effusions are present. Visualized lung bases are otherwise clear. No pericardial effusion.   The liver demonstrates a normal contrast enhanced appearance. Gallbladder within normal limits. No biliary dilatation. The spleen, adrenal glands, and pancreas demonstrate a normal contrast enhanced appearance.  Kidneys are equal in size with symmetric enhancement. No nephrolithiasis, hydronephrosis, or focal enhancing renal mass.  Stomach within normal limits. No evidence for bowel obstruction. No abnormal wall thickening, mucosal  enhancement, or inflammatory fat stranding seen about the bowels. Appendix well visualized in the right lower quadrant and is of normal caliber and appearance without associated inflammatory changes to suggest acute appendicitis.  There is mild circumferential bladder wall thickening, which may be related to incomplete distension. Uterus and ovaries within normal limits for patient age. Small volume free fluid present within the right pelvic cul-de-sac, likely physiologic.  No free air. Right inguinal adenopathy present measuring up to 1.4 cm in short axis. Left inguinal lymph nodes measure up to 1.3 cm in short axis. No intra-abdominal or pelvic adenopathy. Normal intravascular enhancement seen within the abdomen and pelvis.  No acute osseous abnormality. No worrisome lytic or blastic osseous lesions.  IMPRESSION: 1. Mild circumferential bladder wall thickening. While this finding may be related to incomplete distension, possible acute cystitis could also have this appearance. Correlation with urinalysis recommended. 2. Bilateral inguinal adenopathy measuring up to 1.4 cm in short axis. Finding is indeterminate, and may be reactive in nature. This is similar relative to prior CT from 02/14/2014. No intra-abdominal or pelvic adenopathy identified. 3. No other acute intra-abdominal or pelvic process identified. Normal appendix. 4. Small layering bilateral pleural effusions. 5. Small volume free fluid within the pelvis, presumably physiologic.   Electronically Signed   By: Rise Mu M.D.   On: 02/27/2014 03:57   [place] Art/ven Flow Abd Pelv Doppler  02/27/2014   CLINICAL DATA:  Subacute onset right lower quadrant abdominal pain for 2 weeks. Initial encounter.  EXAM: TRANSABDOMINAL AND TRANSVAGINAL ULTRASOUND OF PELVIS  DOPPLER ULTRASOUND OF OVARIES  TECHNIQUE: Both transabdominal and transvaginal ultrasound examinations of the pelvis were performed. Transabdominal technique was performed for global imaging of the pelvis including uterus, ovaries, adnexal regions, and pelvic cul-de-sac.  It was necessary to proceed with endovaginal exam following the transabdominal exam to visualize the uterus and ovaries in greater detail. Color and duplex Doppler ultrasound was utilized to evaluate blood flow to the ovaries.  COMPARISON:  CT of the abdomen and pelvis from 02/14/2014, and pelvic ultrasound performed 02/13/2014  FINDINGS: Uterus  Measurements: 8.0 x 2.9 x 4.1 cm. No fibroids or other mass visualized.  Endometrium  Thickness: 1.1 mm.  No focal abnormality visualized.  Right ovary  Measurements: 3.5 x 2.5 x 2.8 cm. Normal appearance/no adnexal mass.  Left ovary  Measurements: 3.4 x 2.2 x 3.3 cm. Normal appearance/no adnexal mass.  Pulsed Doppler evaluation of both ovaries demonstrates normal low-resistance arterial and venous waveforms.  Other findings  A small amount of free fluid is seen within the pelvic cul-de-sac.  IMPRESSION: Unremarkable pelvic ultrasound.  No evidence for ovarian torsion.   Electronically Signed   By: Roanna Raider M.D.   On: 02/27/2014 02:06   Ct Renal Stone Study  02/14/2014   CLINICAL DATA:  Pelvic fluid collection.  Low back pain.  EXAM: CT ABDOMEN AND PELVIS WITHOUT CONTRAST  TECHNIQUE: Multidetector CT imaging of the abdomen and pelvis was performed following the standard protocol without IV contrast.  COMPARISON:  09/06/2013  FINDINGS: BODY WALL: Unremarkable.  LOWER CHEST: Unremarkable.  ABDOMEN/PELVIS:  Liver: Visible portions negative.  Biliary: No  evidence of biliary obstruction or stone.  Pancreas: Unremarkable.  Spleen: Unremarkable.  Adrenals: Unremarkable.  Kidneys and ureters: No hydronephrosis or stone.  Bladder: Unremarkable.  Reproductive: Unremarkable.  Bowel: No obstruction. The appendix is difficult to visualize in its entirety. No evidence of appendicitis.  Retroperitoneum: No mass or adenopathy.  Peritoneum: Trace pelvic fluid, likely physiologic.  Vascular: No acute abnormality.  OSSEOUS: No acute abnormalities.  IMPRESSION: 1. No acute intra-abdominal findings. 2. Trace free pelvic fluid.   Electronically Signed   By: Tiburcio Pea M.D.   On: 02/14/2014 01:31      Antony Madura, PA-C 02/27/14 0432  Antony Madura, PA-C 02/27/14 [id number]  Elwin Mocha, MD 02/27/14 [phone]

## 2014-03-29 ENCOUNTER — Emergency Department (INDEPENDENT_AMBULATORY_CARE_PROVIDER_SITE_OTHER)
Admission: EM | Admit: 2014-03-29 | Discharge: 2014-03-29 | Disposition: A | Discharge status: Home / Self Care | Payer: BLUE CROSS/BLUE SHIELD | Source: Home / Self Care | Attending: Family Medicine | Admitting: Family Medicine

## 2014-03-29 ENCOUNTER — Other Ambulatory Visit (HOSPITAL_COMMUNITY)
Admission: RE | Admit: 2014-03-29 | Discharge: 2014-03-29 | Disposition: A | Discharge status: Home / Self Care | Payer: BLUE CROSS/BLUE SHIELD | Source: Ambulatory Visit | Attending: Family Medicine | Admitting: Family Medicine

## 2014-03-29 ENCOUNTER — Encounter (HOSPITAL_COMMUNITY): Payer: Self-pay | Admitting: Emergency Medicine

## 2014-03-29 DIAGNOSIS — R300 Dysuria: Secondary | ICD-10-CM

## 2014-03-29 DIAGNOSIS — Z113 Encounter for screening for infections with a predominantly sexual mode of transmission: Secondary | ICD-10-CM | POA: Insufficient documentation

## 2014-03-29 DIAGNOSIS — N760 Acute vaginitis: Secondary | ICD-10-CM

## 2014-03-29 LAB — POCT URINALYSIS DIP (DEVICE)
BILIRUBIN URINE: NEGATIVE
Glucose, UA: NEGATIVE mg/dL
Hgb urine dipstick: NEGATIVE
KETONES UR: NEGATIVE mg/dL
Leukocytes, UA: NEGATIVE
Nitrite: NEGATIVE
PH: 6 (ref 5.0–8.0)
PROTEIN: 100 mg/dL — AB
Urobilinogen, UA: 0.2 mg/dL (ref 0.0–1.0)

## 2014-03-29 LAB — POCT PREGNANCY, URINE: PREG TEST UR: NEGATIVE

## 2014-03-29 MED ORDER — METRONIDAZOLE 500 MG PO TABS: 500.0000 mg | ORAL_TABLET | Freq: Two times a day (BID) | ORAL | Status: DC

## 2014-03-29 NOTE — ED Provider Notes (Signed)
CSN: [id number]     Arrival date & time 03/29/14  1920 History   First MD Initiated Contact with Patient 03/29/14 1946     Chief Complaint  Patient presents with  . Urinary Tract Infection  . Vaginal Discharge   (Consider location/radiation/quality/duration/timing/severity/associated sxs/prior Treatment) HPI         24 year old female presents for evaluation of urinary frequency, urgency, lower abdominal pain, vaginal discharge. This started a few days ago. She is concerned about possible UTI. She does not think she would have an STD but she has had unprotected intercourse. No fever, NVD, or flank pain. No recent travel or sick contacts.  Past Medical History  Diagnosis Date  . Seasonal allergies    History reviewed. No pertinent past surgical history. No family history on file. History  Substance Use Topics  . Smoking status: Never Smoker   . Smokeless tobacco: Never Used  . Alcohol Use: No   OB History    No data available     Review of Systems  Constitutional: Negative for fever and chills.  Respiratory: Negative for shortness of breath.   Cardiovascular: Negative for chest pain.  Gastrointestinal: Positive for abdominal pain. Negative for nausea, vomiting and diarrhea.  Genitourinary: Positive for dysuria, urgency, frequency, vaginal discharge and pelvic pain. Negative for hematuria and flank pain.  Skin: Negative for rash.  All other systems reviewed and are negative.   Allergies  Penicillins  Home Medications   Prior to Admission medications   Medication Sig Start Date End Date Taking? Authorizing Provider  cetirizine (ZYRTEC) 10 MG tablet Take 10 mg by mouth daily as needed for allergies.     Historical Provider, MD  clindamycin (CLEOCIN) 150 MG capsule Take 3 capsules (450 mg total) by mouth 3 (three) times daily. May dispense as  capsules Patient not taking: Reported on 02/13/2014 11/06/13   Harle Battiest, NP  doxycycline (VIBRAMYCIN) 100 MG capsule  Take 1 capsule (100 mg total) by mouth 2 (two) times daily. Patient not taking: Reported on 02/26/2014 02/14/14   Harle Battiest, NP  metroNIDAZOLE (FLAGYL) 500 MG tablet Take 1 tablet (500 mg total) by mouth 2 (two) times daily. 03/29/14   Graylon Good, PA-C  naproxen (NAPROSYN) 500 MG tablet Take 1 tablet (500 mg total) by mouth 2 (two) times daily. 02/27/14   Antony Madura, PA-C  phenazopyridine (PYRIDIUM) 200 MG tablet Take 1 tablet (200 mg total) by mouth 3 (three) times daily. Patient not taking: Reported on 02/26/2014 02/14/14   Harle Battiest, NP  triamcinolone cream (KENALOG) 0.1 % Apply 1 application topically 2 (two) times daily as needed. For eczema. 08/09/13   Historical Provider, MD   BP 113/75 mmHg  Pulse 80  Temp(Src) 98.2 F (36.8 C) (Oral)  Resp 14  SpO2 98%  LMP 03/18/2014 Physical Exam  Constitutional: She is oriented to person, place, and time. Vital signs are normal. She appears well-developed and well-nourished. No distress.  HENT:  Head: Normocephalic and atraumatic.  Pulmonary/Chest: Effort normal. No respiratory distress.  Abdominal: Soft. Bowel sounds are normal. She exhibits no distension and no mass. There is no tenderness. There is no rebound and no guarding.  Genitourinary: There is no tenderness or lesion on the right labia. There is no tenderness or lesion on the left labia. Cervix exhibits no motion tenderness, no discharge and no friability. No erythema or bleeding in the vagina. Vaginal discharge (thin, white) found.  Lymphadenopathy:       Right: No inguinal adenopathy  present.       Left: No inguinal adenopathy present.  Neurological: She is alert and oriented to person, place, and time. She has normal strength. Coordination normal.  Skin: Skin is warm and dry. No rash noted. She is not diaphoretic.  Psychiatric: She has a normal mood and affect. Judgment normal.  Nursing note and vitals reviewed.   ED Course  Procedures (including critical  care time) Labs Review Labs Reviewed  POCT URINALYSIS DIP (DEVICE) - Abnormal; Notable for the following:    Protein, ur 100 (*)    All other components within normal limits  URINE CULTURE  POCT PREGNANCY, URINE  CERVICOVAGINAL ANCILLARY ONLY    Imaging Review No results found.   MDM   1. Dysuria   2. Vaginitis     Possible BV, treat with flagyl, f/u with wom[facility] outpatient clinic if this continues    Meds ordered this encounter  Medications  . metroNIDAZOLE (FLAGYL) 500 MG tablet    Sig: Take 1 tablet (500 mg total) by mouth 2 (two) times daily.    Dispense:  14 tablet    Refill:  0       Graylon GoodZachary H Eriel Doyon, PA-C 03/29/14 2002

## 2014-03-29 NOTE — Discharge Instructions (Signed)
Dysuria Dysuria is the medical term for pain with urination. There are many causes for dysuria, but urinary tract infection is the most common. If a urinalysis was performed it can show that there is a urinary tract infection. A urine culture confirms that you or your child is sick. You will need to follow up with a healthcare provider because:  If a urine culture was done you will need to know the culture results and treatment recommendations.  If the urine culture was positive, you or your child will need to be put on antibiotics or know if the antibiotics prescribed are the right antibiotics for your urinary tract infection.  If the urine culture is negative (no urinary tract infection), then other causes may need to be explored or antibiotics need to be stopped. Today laboratory work may have been done and there does not seem to be an infection. If cultures were done they will take at least 24 to 48 hours to be completed. Today x-rays may have been taken and they read as normal. No cause can be found for the problems. The x-rays may be re-read by a radiologist and you will be contacted if additional findings are made. You or your child may have been put on medications to help with this problem until you can see your primary caregiver. If the problems get better, see your primary caregiver if the problems return. If you were given antibiotics (medications which kill germs), take all of the mediations as directed for the full course of treatment.  If laboratory work was done, you need to find the results. Leave a telephone number where you can be reached. If this is not possible, make sure you find out how you are to get test results. HOME CARE INSTRUCTIONS   Drink lots of fluids. For adults, drink eight, 8 ounce glasses of clear juice or water a day. For children, replace fluids as suggested by your caregiver.  Empty the bladder often. Avoid holding urine for long periods of time.  After a bowel  movement, women should cleanse front to back, using each tissue only once.  Empty your bladder before and after sexual intercourse.  Take all the medicine given to you until it is gone. You may feel better in a few days, but TAKE ALL MEDICINE.  Avoid caffeine, tea, alcohol and carbonated beverages, because they tend to irritate the bladder.  In men, alcohol may irritate the prostate.  Only take over-the-counter or prescription medicines for pain, discomfort, or fever as directed by your caregiver.  If your caregiver has given you a follow-up appointment, it is very important to keep that appointment. Not keeping the appointment could result in a chronic or permanent injury, pain, and disability. If there is any problem keeping the appointment, you must call back to this facility for assistance. SEEK IMMEDIATE MEDICAL CARE IF:   Back pain develops.  A fever develops.  There is nausea (feeling sick to your stomach) or vomiting (throwing up).  Problems are no better with medications or are getting worse. MAKE SURE YOU:   Understand these instructions.  Will watch your condition.  Will get help right away if you are not doing well or get worse. Document Released: 11/01/2003 Document Revised: 04/27/2011 Document Reviewed: 09/08/2007 [facility] Patient Information 2015 [place]ExitCare, [place]. This information is not intended to replace advice given to you by your health care provider. Make sure you discuss any questions you have with your health care provider.  Vaginitis Vaginitis is  an inflammation of the vagina. It is most often caused by a change in the normal balance of the bacteria and yeast that live in the vagina. This change in balance causes an overgrowth of certain bacteria or yeast, which causes the inflammation. There are different types of vaginitis, but the most common types are:  Bacterial vaginosis.  Yeast infection (candidiasis).  Trichomoniasis vaginitis. This is a sexually  transmitted infection (STI).  Viral vaginitis.  Atropic vaginitis.  Allergic vaginitis. CAUSES  The cause depends on the type of vaginitis. Vaginitis can be caused by:  Bacteria (bacterial vaginosis).  Yeast (yeast infection).  A parasite (trichomoniasis vaginitis)  A virus (viral vaginitis).  Low hormone levels (atrophic vaginitis). Low hormone levels can occur during pregnancy, breastfeeding, or after menopause.  Irritants, such as bubble baths, scented tampons, and feminine sprays (allergic vaginitis). Other factors can change the normal balance of the yeast and bacteria that live in the vagina. These include:  Antibiotic medicines.  Poor hygiene.  Diaphragms, vaginal sponges, spermicides, birth control pills, and intrauterine devices (IUD).  Sexual intercourse.  Infection.  Uncontrolled diabetes.  A weakened immune system. SYMPTOMS  Symptoms can vary depending on the cause of the vaginitis. Common symptoms include:  Abnormal vaginal discharge.  The discharge is white, gray, or yellow with bacterial vaginosis.  The discharge is thick, white, and cheesy with a yeast infection.  The discharge is frothy and yellow or greenish with trichomoniasis.  A bad vaginal odor.  The odor is fishy with bacterial vaginosis.  Vaginal itching, pain, or swelling.  Painful intercourse.  Pain or burning when urinating. Sometimes, there are no symptoms. TREATMENT  Treatment will vary depending on the type of infection.   Bacterial vaginosis and trichomoniasis are often treated with antibiotic creams or pills.  Yeast infections are often treated with antifungal medicines, such as vaginal creams or suppositories.  Viral vaginitis has no cure, but symptoms can be treated with medicines that relieve discomfort. Your sexual partner should be treated as well.  Atrophic vaginitis may be treated with an estrogen cream, pill, suppository, or vaginal ring. If vaginal dryness  occurs, lubricants and moisturizing creams may help. You may be told to avoid scented soaps, sprays, or douches.  Allergic vaginitis treatment involves quitting the use of the product that is causing the problem. Vaginal creams can be used to treat the symptoms. HOME CARE INSTRUCTIONS   Take all medicines as directed by your caregiver.  Keep your genital area clean and dry. Avoid soap and only rinse the area with water.  Avoid douching. It can remove the healthy bacteria in the vagina.  Do not use tampons or have sexual intercourse until your vaginitis has been treated. Use sanitary pads while you have vaginitis.  Wipe from front to back. This avoids the spread of bacteria from the rectum to the vagina.  Let air reach your genital area.  Wear cotton underwear to decrease moisture buildup.  Avoid wearing underwear while you sleep until your vaginitis is gone.  Avoid tight pants and underwear or nylons without a cotton panel.  Take off wet clothing (especially bathing suits) as soon as possible.  Use mild, non-scented products. Avoid using irritants, such as:  Scented feminine sprays.  Fabric softeners.  Scented detergents.  Scented tampons.  Scented soaps or bubble baths.  Practice safe sex and use condoms. Condoms may prevent the spread of trichomoniasis and viral vaginitis. SEEK MEDICAL CARE IF:   You have abdominal pain.  You have a  fever or persistent symptoms for more than 2-3 days.  You have a fever and your symptoms suddenly get worse. Document Released: 11/30/2006 Document Revised: 10/28/2011 Document Reviewed: 07/16/2011 [facility] Patient Information 2015 [place]ExitCare, [place]LLC. This information is not intended to replace advice given to you by your health care provider. Make sure you discuss any questions you have with your health care provider.

## 2014-03-29 NOTE — ED Notes (Signed)
C/o  Urinary urgency and frequency.  Vaginal discharge.   Denies fever n/v/d.   Symptoms present since Wednesday.

## 2014-03-30 LAB — CERVICOVAGINAL ANCILLARY ONLY
CHLAMYDIA, DNA PROBE: NEGATIVE
Neisseria Gonorrhea: NEGATIVE

## 2014-03-31 LAB — URINE CULTURE: Colony Count: 4000

## 2014-04-02 LAB — CERVICOVAGINAL ANCILLARY ONLY
WET PREP (BD AFFIRM): POSITIVE — AB
Wet Prep (BD Affirm): NEGATIVE
Wet Prep (BD Affirm): NEGATIVE

## 2014-04-02 NOTE — ED Notes (Signed)
GC/Chlamydia neg., Affirm: Candida and Trich neg., Gardnerella pos., Urine culture: Insignificant growth.  Pt. adequately treated with Flagyl. Vassie MoselleYork, Broghan Pannone M 04/02/2014

## 2014-04-16 ENCOUNTER — Emergency Department (HOSPITAL_COMMUNITY): Payer: BLUE CROSS/BLUE SHIELD

## 2014-04-16 ENCOUNTER — Emergency Department (HOSPITAL_COMMUNITY)
Admission: EM | Admit: 2014-04-16 | Discharge: 2014-04-17 | Disposition: A | Discharge status: Home / Self Care | Payer: BLUE CROSS/BLUE SHIELD | Attending: Emergency Medicine | Admitting: Emergency Medicine

## 2014-04-16 ENCOUNTER — Encounter (HOSPITAL_COMMUNITY): Payer: Self-pay | Admitting: *Deleted

## 2014-04-16 DIAGNOSIS — Z880 Allergy status to penicillin: Secondary | ICD-10-CM | POA: Diagnosis not present

## 2014-04-16 DIAGNOSIS — R0789 Other chest pain: Secondary | ICD-10-CM | POA: Diagnosis not present

## 2014-04-16 DIAGNOSIS — Z792 Long term (current) use of antibiotics: Secondary | ICD-10-CM | POA: Insufficient documentation

## 2014-04-16 DIAGNOSIS — Z791 Long term (current) use of non-steroidal anti-inflammatories (NSAID): Secondary | ICD-10-CM | POA: Insufficient documentation

## 2014-04-16 DIAGNOSIS — Z3202 Encounter for pregnancy test, result negative: Secondary | ICD-10-CM | POA: Insufficient documentation

## 2014-04-16 DIAGNOSIS — F419 Anxiety disorder, unspecified: Secondary | ICD-10-CM | POA: Diagnosis not present

## 2014-04-16 DIAGNOSIS — Z79899 Other long term (current) drug therapy: Secondary | ICD-10-CM | POA: Insufficient documentation

## 2014-04-16 DIAGNOSIS — R079 Chest pain, unspecified: Secondary | ICD-10-CM | POA: Diagnosis present

## 2014-04-16 LAB — BASIC METABOLIC PANEL
Anion gap: 9 (ref 5–15)
BUN: 14 mg/dL (ref 6–23)
CHLORIDE: 104 mmol/L (ref 96–112)
CO2: 26 mmol/L (ref 19–32)
Calcium: 9 mg/dL (ref 8.4–10.5)
Creatinine, Ser: 0.57 mg/dL (ref 0.50–1.10)
GFR calc Af Amer: >90 mL/min (ref >90)
Glucose, Bld: 102 mg/dL — ABNORMAL HIGH (ref 70–99)
Potassium: 3.8 mmol/L (ref 3.5–5.1)
Sodium: 139 mmol/L (ref 135–145)

## 2014-04-16 LAB — CBC
HCT: 39.9 % (ref 36.0–46.0)
HEMOGLOBIN: 13 g/dL (ref 12.0–15.0)
MCH: 28 pg (ref 26.0–34.0)
MCHC: 32.6 g/dL (ref 30.0–36.0)
MCV: 85.8 fL (ref 78.0–100.0)
PLATELETS: 246 10*3/uL (ref 150–400)
RBC: 4.65 MIL/uL (ref 3.87–5.11)
RDW: 14.1 % (ref 11.5–15.5)
WBC: 8.2 10*3/uL (ref 4.0–10.5)

## 2014-04-16 LAB — I-STAT TROPONIN, ED: TROPONIN I, POC: 0 ng/mL (ref 0.00–0.08)

## 2014-04-16 NOTE — ED Notes (Signed)
Pt reports R chest tightness x 2 hours ago.  Pt reports back pain with the pain but denies any SOB, dizziness or diaphoresis at this time.j

## 2014-04-17 LAB — PREGNANCY, URINE: Preg Test, Ur: NEGATIVE

## 2014-04-17 MED ORDER — HYDROXYZINE HCL 10 MG PO TABS: 10.0000 mg | ORAL_TABLET | Freq: Four times a day (QID) | ORAL | Status: DC | PRN

## 2014-04-17 NOTE — Discharge Instructions (Signed)
Chest Pain (Nonspecific) °It is often hard to give a specific diagnosis for the cause of chest pain. There is always a chance that your pain could be related to something serious, such as a heart attack or a blood clot in the lungs. You need to follow up with your health care provider for further evaluation. °CAUSES  °· Heartburn. °· Pneumonia or bronchitis. °· Anxiety or stress. °· Inflammation around your heart (pericarditis) or lung (pleuritis or pleurisy). °· A blood clot in the lung. °· A collapsed lung (pneumothorax). It can develop suddenly on its own (spontaneous pneumothorax) or from trauma to the chest. °· Shingles infection (herpes zoster virus). °The chest wall is composed of bones, muscles, and cartilage. Any of these can be the source of the pain. °· The bones can be bruised by injury. °· The muscles or cartilage can be strained by coughing or overwork. °· The cartilage can be affected by inflammation and become sore (costochondritis). °DIAGNOSIS  °Lab tests or other studies may be needed to find the cause of your pain. Your health care provider may have you take a test called an ambulatory electrocardiogram (ECG). An ECG records your heartbeat patterns over a 24-hour period. You may also have other tests, such as: °· Transthoracic echocardiogram (TTE). During echocardiography, sound waves are used to evaluate how blood flows through your heart. °· Transesophageal echocardiogram (TEE). °· Cardiac monitoring. This allows your health care provider to monitor your heart rate and rhythm in real time. °· Holter monitor. This is a portable device that records your heartbeat and can help diagnose heart arrhythmias. It allows your health care provider to track your heart activity for several days, if needed. °· Stress tests by exercise or by giving medicine that makes the heart beat faster. °TREATMENT  °· Treatment depends on what may be causing your chest pain. Treatment may include: °¨ Acid blockers for  heartburn. °¨ Anti-inflammatory medicine. °¨ Pain medicine for inflammatory conditions. °¨ Antibiotics if an infection is present. °· You may be advised to change lifestyle habits. This includes stopping smoking and avoiding alcohol, caffeine, and chocolate. °· You may be advised to keep your head raised (elevated) when sleeping. This reduces the chance of acid going backward from your stomach into your esophagus. °Most of the time, nonspecific chest pain will improve within 2-3 days with rest and mild pain medicine.  °HOME CARE INSTRUCTIONS  °· If antibiotics were prescribed, take them as directed. Finish them even if you start to feel better. °· For the next few days, avoid physical activities that bring on chest pain. Continue physical activities as directed. °· Do not use any tobacco products, including cigarettes, chewing tobacco, or electronic cigarettes. °· Avoid drinking alcohol. °· Only take medicine as directed by your health care provider. °· Follow your health care provider's suggestions for further testing if your chest pain does not go away. °· Keep any follow-up appointments you made. If you do not go to an appointment, you could develop lasting (chronic) problems with pain. If there is any problem keeping an appointment, call to reschedule. °SEEK MEDICAL CARE IF:  °· Your chest pain does not go away, even after treatment. °· You have a rash with blisters on your chest. °· You have a fever. °SEEK IMMEDIATE MEDICAL CARE IF:  °· You have increased chest pain or pain that spreads to your arm, neck, jaw, back, or abdomen. °· You have shortness of breath. °· You have an increasing cough, or you cough   up blood. °· You have severe back or abdominal pain. °· You feel nauseous or vomit. °· You have severe weakness. °· You faint. °· You have chills. °This is an emergency. Do not wait to see if the pain will go away. Get medical help at once. Call your local emergency services (911 in [place] Do not drive  yourself to the hospital. °MAKE SURE YOU:  °· Understand these instructions. °· Will watch your condition. °· Will get help right away if you are not doing well or get worse. °Document Released: 11/12/2004 Document Revised: 02/07/2013 Document Reviewed: 09/08/2007 °ExitCare® Patient Information ©2015 Exit[employer], LLC. This information is not intended to replace advice given to you by your health care provider. Make sure you discuss any questions you have with your health care provider. °Generalized Anxiety Disorder °Generalized anxiety disorder (GAD) is a mental disorder. It interferes with life functions, including relationships, work, and school. °GAD is different from normal anxiety, which everyone experiences at some point in their lives in response to specific life events and activities. Normal anxiety actually helps us prepare for and get through these life events and activities. Normal anxiety goes away after the event or activity is over.  °GAD causes anxiety that is not necessarily related to specific events or activities. It also causes excess anxiety in proportion to specific events or activities. The anxiety associated with GAD is also difficult to control. GAD can vary from mild to severe. People with severe GAD can have intense waves of anxiety with physical symptoms (panic attacks).  °SYMPTOMS °The anxiety and worry associated with GAD are difficult to control. This anxiety and worry are related to many life events and activities and also occur more days than not for 6 months or longer. People with GAD also have three or more of the following symptoms (one or more in children): °· Restlessness.   °· Fatigue. °· Difficulty concentrating.   °· Irritability. °· Muscle tension. °· Difficulty sleeping or unsatisfying sleep. °DIAGNOSIS °GAD is diagnosed through an assessment by your health care provider. Your health care provider will ask you questions about your mood, physical symptoms, and events in your  life. Your health care provider may ask you about your medical history and use of alcohol or drugs, including prescription medicines. Your health care provider may also do a physical exam and blood tests. Certain medical conditions and the use of certain substances can cause symptoms similar to those associated with GAD. Your health care provider may refer you to a mental health specialist for further evaluation. °TREATMENT °The following therapies are usually used to treat GAD:  °· Medication. Antidepressant medication usually is prescribed for long-term daily control. Antianxiety medicines may be added in severe cases, especially when panic attacks occur.   °· Talk therapy (psychotherapy). Certain types of talk therapy can be helpful in treating GAD by providing support, education, and guidance. A form of talk therapy called cognitive behavioral therapy can teach you healthy ways to think about and react to daily life events and activities. °· Stress management techniques. These include yoga, meditation, and exercise and can be very helpful when they are practiced regularly. °A mental health specialist can help determine which treatment is best for you. Some people see improvement with one therapy. However, other people require a combination of therapies. °Document Released: 05/30/2012 Document Revised: 06/19/2013 Document Reviewed: 05/30/2012 °ExitCare® Patient Information ©2015 Exit[employer], LLC. This information is not intended to replace advice given to you by your health care provider. Make sure you discuss any questions you   have with your health care provider. ° °

## 2014-04-17 NOTE — ED Provider Notes (Signed)
CSN: [id number]     Arrival date & time 04/16/14  2109 History   First MD Initiated Contact with Patient 04/16/14 2346     Chief Complaint  Patient presents with  . Chest Pain   Julia Hall is a 24 y.o. female with no known medical problem who presents to the ED complaining of right sided chest tightness and pain starting around 7 pm tonight. She questions whether this is due to stress and anxiety as she has been more stressed out recently. She currently complains of right-sided chest pain that she rates at a 4/10, that she describes as sharp and tightness. She reports feeling anxious and stressed. She is unable to identify alleviating or aggravating factors. She has not taken anything for treatment today. The patient denies fevers, chills, cough, wheezing, shortness of breath, headache, lightheadedness, dizziness, or palpitations. The patient denies personal or family history of DVTs or PEs. Patient denies personal or family history of blood clotting disorders such as factor V Leiden, protein C or S deficiency. Patient denies recent long travel, recent surgery or exogenous estrogen use. The patient is not a smoker.  (Consider location/radiation/quality/duration/timing/severity/associated sxs/prior Treatment) HPI  Past Medical History  Diagnosis Date  . Seasonal allergies    History reviewed. No pertinent past surgical history. No family history on file. History  Substance Use Topics  . Smoking status: Never Smoker   . Smokeless tobacco: Never Used  . Alcohol Use: No   OB History    No data available     Review of Systems  Constitutional: Negative for fever and chills.  HENT: Negative for congestion and sore throat.   Eyes: Negative for visual disturbance.  Respiratory: Negative for cough, shortness of breath and wheezing.   Cardiovascular: Positive for chest pain. Negative for palpitations and leg swelling.  Gastrointestinal: Negative for nausea, vomiting, abdominal pain and  diarrhea.  Genitourinary: Negative for dysuria, frequency and difficulty urinating.  Musculoskeletal: Negative for back pain and neck pain.  Skin: Negative for rash.  Neurological: Negative for dizziness, weakness, light-headedness, numbness and headaches.  Psychiatric/Behavioral: The patient is nervous/anxious.       Allergies  Penicillins  Home Medications   Prior to Admission medications   Medication Sig Start Date End Date Taking? Authorizing Provider  cetirizine (ZYRTEC) 10 MG tablet Take 10 mg by mouth daily as needed for allergies.     Historical Provider, MD  clindamycin (CLEOCIN) 150 MG capsule Take 3 capsules (450 mg total) by mouth 3 (three) times daily. May dispense as  capsules Patient not taking: Reported on 02/13/2014 11/06/13   Harle Battiest, NP  doxycycline (VIBRAMYCIN) 100 MG capsule Take 1 capsule (100 mg total) by mouth 2 (two) times daily. Patient not taking: Reported on 02/26/2014 02/14/14   Harle Battiest, NP  hydrOXYzine (ATARAX/VISTARIL) 10 MG tablet Take 1 tablet (10 mg total) by mouth every 6 (six) hours as needed for anxiety. 04/17/14   Einar Gip Adelene Polivka, PA-C  metroNIDAZOLE (FLAGYL) 500 MG tablet Take 1 tablet (500 mg total) by mouth 2 (two) times daily. Patient not taking: Reported on 04/16/2014 03/29/14   Graylon Good, PA-C  naproxen (NAPROSYN) 500 MG tablet Take 1 tablet (500 mg total) by mouth 2 (two) times daily. Patient not taking: Reported on 04/16/2014 02/27/14   Antony Madura, PA-C  phenazopyridine (PYRIDIUM) 200 MG tablet Take 1 tablet (200 mg total) by mouth 3 (three) times daily. Patient not taking: Reported on 02/26/2014 02/14/14   Harle Battiest, NP  triamcinolone  cream (KENALOG) 0.1 % Apply 1 application topically 2 (two) times daily as needed. For eczema. 08/09/13   Historical Provider, MD   BP 125/77 mmHg  Pulse 85  Temp(Src) 98.1 F (36.7 C) (Oral)  Resp 14  SpO2 100%  LMP 03/18/2014 Physical Exam  Constitutional: She  is oriented to person, place, and time. She appears well-developed and well-nourished. No distress.  HENT:  Head: Normocephalic and atraumatic.  Mouth/Throat: Oropharynx is clear and moist. No oropharyngeal exudate.  Eyes: Conjunctivae are normal. Pupils are equal, round, and reactive to light. Right eye exhibits no discharge. Left eye exhibits no discharge.  Neck: Neck supple. No JVD present. No tracheal deviation present.  Cardiovascular: Normal rate, regular rhythm, normal heart sounds and intact distal pulses.  Exam reveals no gallop and no friction rub.   No murmur heard. HR 76. Bilateral radial, posterior tibialis and dorsalis pedis pulses are intact.   Pulmonary/Chest: Effort normal and breath sounds normal. No respiratory distress. She has no wheezes. She has no rales. She exhibits tenderness.  Right chest is tender to palpation and reproduces her chest pain.   Abdominal: Soft. Bowel sounds are normal. She exhibits no distension. There is no tenderness.  Musculoskeletal: She exhibits no edema or tenderness.  No lower extremity edema or tenderness.   Lymphadenopathy:    She has no cervical adenopathy.  Neurological: She is alert and oriented to person, place, and time. Coordination normal.  Skin: Skin is warm and dry. No rash noted. She is not diaphoretic. No erythema. No pallor.  Psychiatric: Her behavior is normal. Her mood appears anxious. Her affect is not angry. She does not exhibit a depressed mood.  Patient appears slightly anxious.   Nursing note and vitals reviewed.   ED Course  Procedures (including critical care time) Labs Review Labs Reviewed  BASIC METABOLIC PANEL - Abnormal; Notable for the following:    Glucose, Bld 102 (*)    All other components within normal limits  CBC  PREGNANCY, URINE  I-STAT TROPOININ, ED    Imaging Review Dg Chest 2 View  04/16/2014   CLINICAL DATA:  Right chest pain starting 4 hr ago  EXAM: CHEST  2 VIEW  COMPARISON:  None.   FINDINGS: Cardiomediastinal silhouette is unremarkable. No acute infiltrate or pleural effusion. No pulmonary edema. Bony thorax is unremarkable.  IMPRESSION: No active cardiopulmonary disease.   Electronically Signed   By: Natasha Mead M.D.   On: 04/16/2014 22:56     EKG Interpretation   Date/Time:  Monday April 16 2014 21:40:14 EST Ventricular Rate:  80 PR Interval:  124 QRS Duration: 83 QT Interval:  381 QTC Calculation: 439 R Axis:   33 Text Interpretation:  Sinus rhythm Confirmed by Erroll Luna  [phone]) on 04/17/2014 12:29:53 AM      Filed Vitals:   04/16/14 2145 04/16/14 2349  BP: 126/75 125/77  Pulse: 102 85  Temp: 98.1 F (36.7 C)   TempSrc: Oral   Resp: 20 14  SpO2: 100% 100%     MDM   Meds given in ED:  Medications - No data to display  New Prescriptions   HYDROXYZINE (ATARAX/VISTARIL) 10 MG TABLET    Take 1 tablet (10 mg total) by mouth every 6 (six) hours as needed for anxiety.    Final diagnoses:  Chest wall pain  Anxiety   This is a 24 y.o. female with no known medical problem who presents to the ED complaining of right sided chest tightness  and pain starting around 7 pm tonight. She questions whether this is due to stress and anxiety as she has been more stressed out recently. She currently complains of right-sided chest pain that she rates at a 4/10, that she describes as sharp and tightness. She reports feeling anxious and stressed. Patient is afebrile and nontoxic-appearing. The patient does appear slightly anxious during interview. The patient is not tachypneic, tachycardic or hypoxic. The patient has right-sided chest tenderness which reproduces her pain. Negative urine pregnancy test. Troponin is negative. CBC and CMP are unremarkable. Chest x-ray is negative. I am not concerned for PE, or ACS,.  Will discharge with vistaril for anxiety. Advised patient to follow-up with [facility] counseling Center [facility] [employer]. I advised the  patient to follow-up with their primary care provider this week. I advised the patient to return to the emergency department with new or worsening symptoms or new concerns. The patient verbalized understanding and agreement with plan.       Lawana ChambersWilliam Duncan Uriah Trueba, PA-C 04/17/14 [phone]  Tomasita CrumbleAdeleke Oni, MD 04/17/14 [phone]

## 2014-07-26 ENCOUNTER — Emergency Department (HOSPITAL_COMMUNITY)
Admission: EM | Admit: 2014-07-26 | Discharge: 2014-07-26 | Disposition: A | Discharge status: Home / Self Care | Payer: BLUE CROSS/BLUE SHIELD | Attending: Emergency Medicine | Admitting: Emergency Medicine

## 2014-07-26 ENCOUNTER — Encounter (HOSPITAL_COMMUNITY): Payer: Self-pay | Admitting: Emergency Medicine

## 2014-07-26 DIAGNOSIS — Z880 Allergy status to penicillin: Secondary | ICD-10-CM | POA: Diagnosis not present

## 2014-07-26 DIAGNOSIS — M722 Plantar fascial fibromatosis: Secondary | ICD-10-CM | POA: Diagnosis not present

## 2014-07-26 DIAGNOSIS — N898 Other specified noninflammatory disorders of vagina: Secondary | ICD-10-CM | POA: Diagnosis not present

## 2014-07-26 DIAGNOSIS — M79671 Pain in right foot: Secondary | ICD-10-CM | POA: Diagnosis present

## 2014-07-26 LAB — WET PREP, GENITAL
Clue Cells Wet Prep HPF POC: NONE SEEN
TRICH WET PREP: NONE SEEN
Yeast Wet Prep HPF POC: NONE SEEN

## 2014-07-26 MED ORDER — IBUPROFEN 800 MG PO TABS: 800.0000 mg | ORAL_TABLET | Freq: Three times a day (TID) | ORAL | Status: DC

## 2014-07-26 MED ORDER — IBUPROFEN 800 MG PO TABS
800.0000 mg | ORAL_TABLET | Freq: Once | ORAL | Status: AC
  Administered 2014-07-26: 800 mg via ORAL
  Filled 2014-07-26: qty 1

## 2014-07-26 NOTE — Discharge Instructions (Signed)
Take motrin for pain.   Wear comfortable shoes and try some inserts.   Stay off your leg as much as possible.   Follow up with your doctor.   Return to ER if you have severe pain, unable to walk, worse vaginal discharge.

## 2014-07-26 NOTE — ED Notes (Signed)
Pt states she has a knot on bottom if right foot, hurts to walk on it Pt also c/o of a vaginal d/c of white cottage cheese consistency along with itching.

## 2014-07-26 NOTE — ED Provider Notes (Signed)
CSN: [id number]     Arrival date & time 07/26/14  2836 History   First MD Initiated Contact with Patient 07/26/14 0940     Chief Complaint  Patient presents with  . Foot Pain  . Vaginal Itching     (Consider location/radiation/quality/duration/timing/severity/associated sxs/prior Treatment) The history is provided by the patient.  Julia Hall is a 24 y.o. female history of bacterial vaginosis here with vaginal discharge, right foot pain. She has been starting a new job and is on her feet a lot. Has to wear some steel boots so noticed that the bottom of her right foot is painful when she walks on it. Denies any fall or injury. Denies any numbness or weakness. Patient also recently was diagnosed with bacterial vaginosis and finish her course of Flagyl but she states that she has not been taking it as prescribed. She now has some cottage cheese like white discharge discharge. Denies any vaginal pain or vaginal bleeding or abdominal pain. Denies being pregnant.    Past Medical History  Diagnosis Date  . Seasonal allergies    History reviewed. No pertinent past surgical history. History reviewed. No pertinent family history. History  Substance Use Topics  . Smoking status: Never Smoker   . Smokeless tobacco: Never Used  . Alcohol Use: No   OB History    No data available     Review of Systems  Genitourinary: Positive for vaginal discharge.  Musculoskeletal:       R foot pain   All other systems reviewed and are negative.     Allergies  Penicillins  Home Medications   Prior to Admission medications   Medication Sig Start Date End Date Taking? Authorizing Provider  cetirizine (ZYRTEC) 10 MG tablet Take 10 mg by mouth daily as needed for allergies.     Historical Provider, MD  hydrOXYzine (ATARAX/VISTARIL) 10 MG tablet Take 1 tablet (10 mg total) by mouth every 6 (six) hours as needed for anxiety. 04/17/14   Everlene Farrier, PA-C  metroNIDAZOLE (FLAGYL) 500 MG tablet Take 1  tablet (500 mg total) by mouth 2 (two) times daily. Patient not taking: Reported on 04/16/2014 03/29/14   Graylon Good, PA-C  naproxen (NAPROSYN) 500 MG tablet Take 1 tablet (500 mg total) by mouth 2 (two) times daily. Patient not taking: Reported on 04/16/2014 02/27/14   Antony Madura, PA-C  phenazopyridine (PYRIDIUM) 200 MG tablet Take 1 tablet (200 mg total) by mouth 3 (three) times daily. Patient not taking: Reported on 02/26/2014 02/14/14   Harle Battiest, NP  triamcinolone cream (KENALOG) 0.1 % Apply 1 application topically 2 (two) times daily as needed. For eczema. 08/09/13   Historical Provider, MD   BP 116/79 mmHg  Pulse 72  Temp(Src) 98 F (36.7 C) (Oral)  Resp 20  SpO2 100%  LMP 07/16/2014 (Exact Date) Physical Exam  Constitutional: She is oriented to person, place, and time. She appears well-developed and well-nourished.  HENT:  Head: Normocephalic.  Mouth/Throat: Oropharynx is clear and moist.  Eyes: Conjunctivae are normal. Pupils are equal, round, and reactive to light.  Neck: Normal range of motion. Neck supple.  Cardiovascular: Normal rate, regular rhythm and normal heart sounds.   Pulmonary/Chest: Effort normal and breath sounds normal. No respiratory distress. She has no wheezes. She has no rales.  Abdominal: Soft. Bowel sounds are normal. She exhibits no distension. There is no tenderness. There is no rebound.  Genitourinary:  Whitish discharge, no bleeding. No CMT or uterine or adnexal tenderness  Musculoskeletal:  R foot slight tenderness and swelling plantar aspect of anterior to the heel, no obvious deformities. No base of 5th or toe tenderness   Neurological: She is alert and oriented to person, place, and time.  Skin: Skin is warm and dry.  Psychiatric: She has a normal mood and affect. Her behavior is normal. Judgment and thought content normal.  Nursing note and vitals reviewed.   ED Course  Procedures (including critical care time) Labs Review Labs  Reviewed  WET PREP, GENITAL - Abnormal; Notable for the following:    WBC, Wet Prep HPF POC FEW (*)    All other components within normal limits  GC/CHLAMYDIA PROBE AMP ([place]) NOT AT [facility]    Imaging Review No results found.   EKG Interpretation None      MDM   Final diagnoses:  None    Julia Hall is a 24 y.o. female here with heel pain, vag discharge. Heel pain likely from plantar fasciitis from being on her feet a lot. Suggest motrin, stay off her foot, more comfortable shoes or inserts. Recent finished abx for BV, consider yeast infection. Will send wet prep. Doesn't want empiric treatment for GC/chlamydia as she tested neg a week ago at an outside hospital.   10:28 AM Wet prep showed no yeast or BV or trichomonas. Doesn't want empiric treatment for gc/chlamydia. Will dc home.     Richardean Canal, MD 07/26/14 [phone]

## 2014-07-27 LAB — GC/CHLAMYDIA PROBE AMP (~~LOC~~) NOT AT ARMC
Chlamydia: NEGATIVE
Neisseria Gonorrhea: NEGATIVE

## 2014-08-30 IMAGING — US US PELVIS COMPLETE
1 series · 14 of 25 positions shown · non-contrast
Comparison: None

CLINICAL DATA: Right-sided pelvic pain.  Irregular menses.

EXAM:
TRANSABDOMINAL AND TRANSVAGINAL ULTRASOUND OF PELVIS
TECHNIQUE: Both transabdominal and transvaginal ultrasound examinations of the
pelvis were performed. Transabdominal technique was performed for
global imaging of the pelvis including uterus, ovaries, adnexal
regions, and pelvic cul-de-sac. It was necessary to proceed with
endovaginal exam following the transabdominal exam to visualize the
uterus, endometrium and ovaries to better advantage.

[Series 1: us pelvis complete · 0.22mm/px · 14 of 76 slices shown]
[im 1/76]
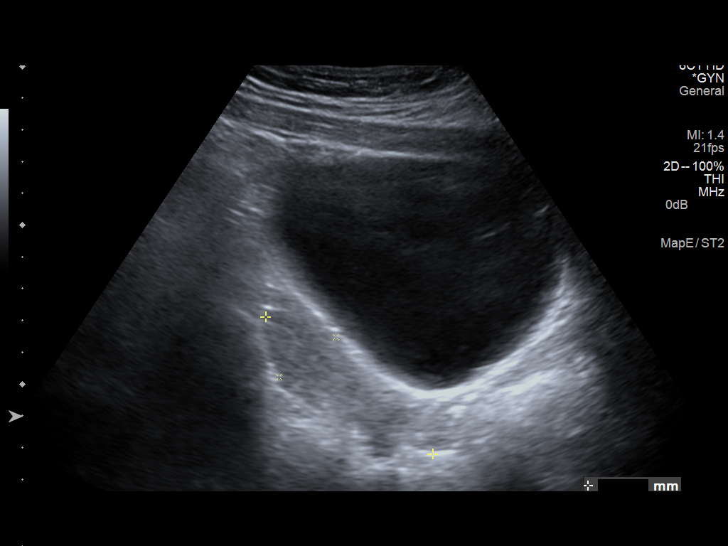
[im 7/76]
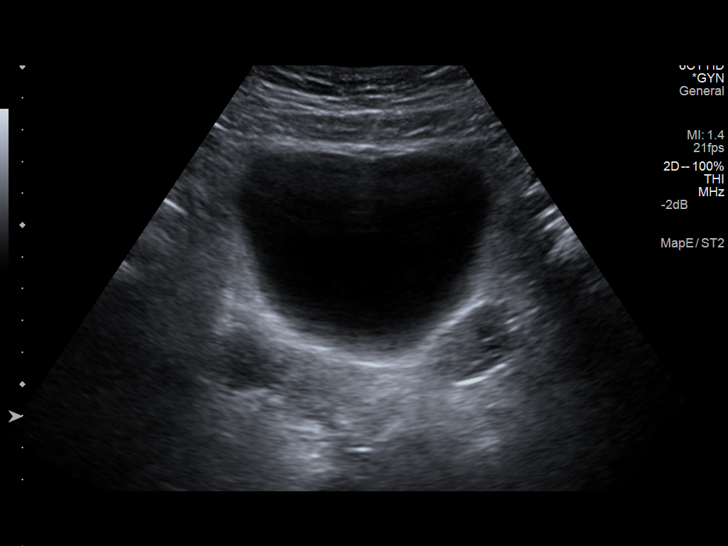
[im 13/76]
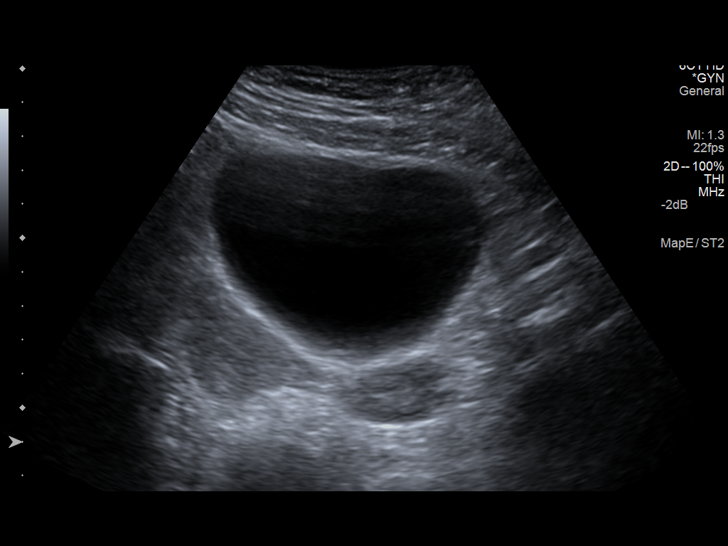
[im 19/76]
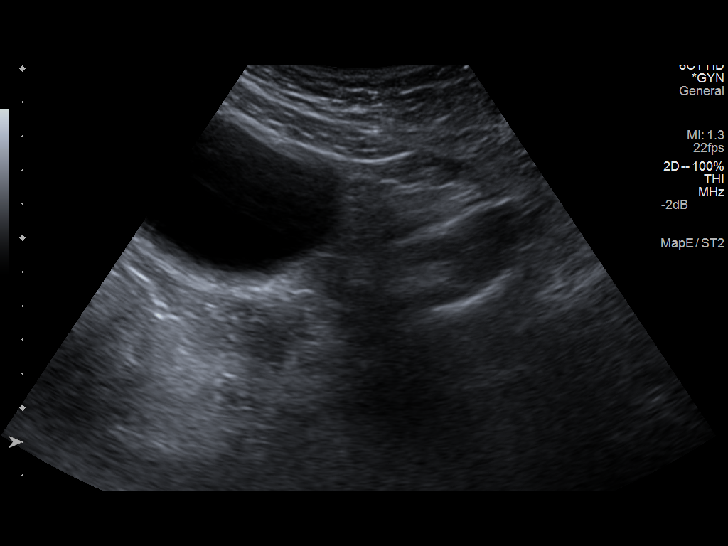
[im 26/76]
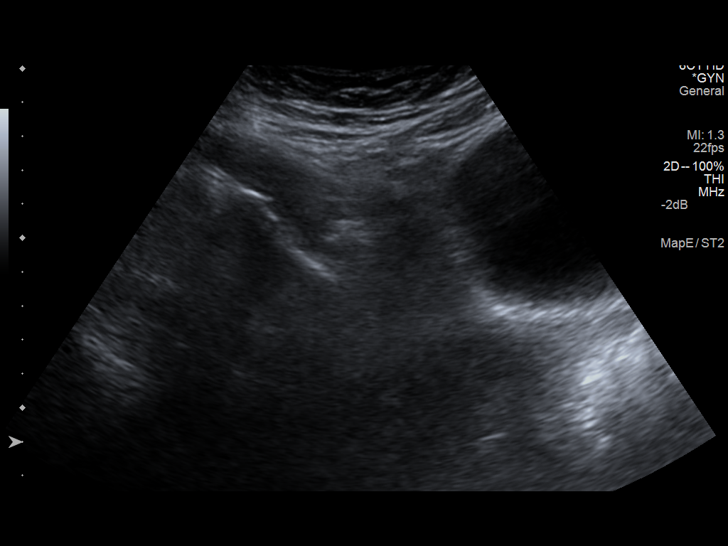
[im 29/76]
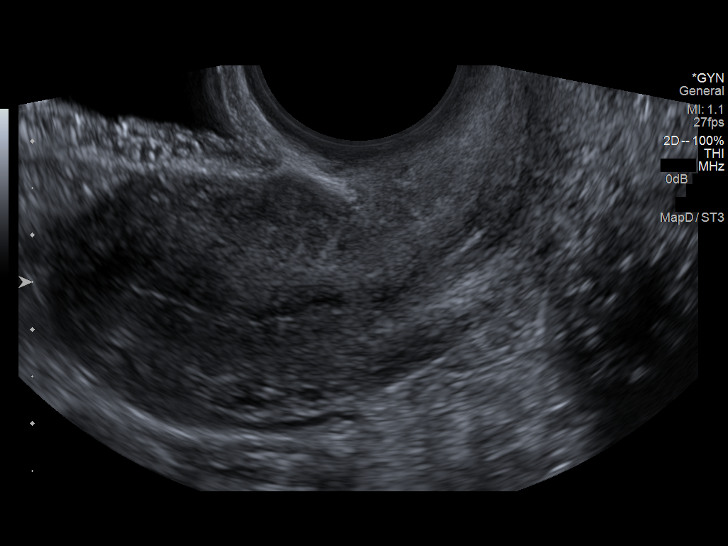
[im 35/76]
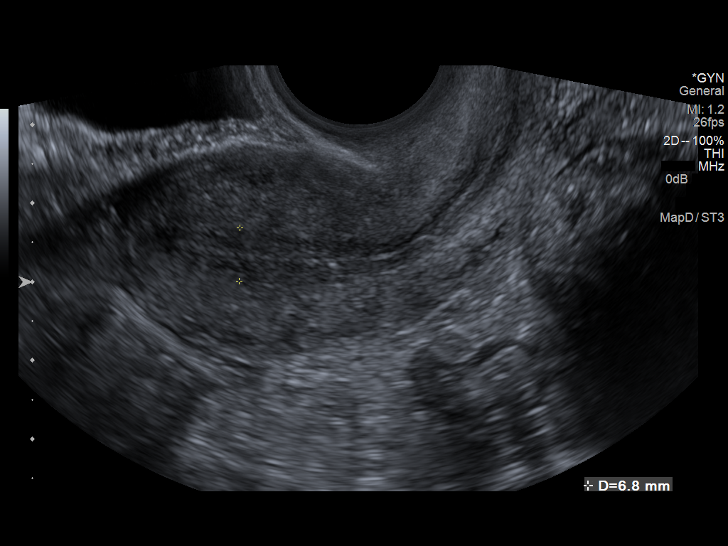
[im 41/76]
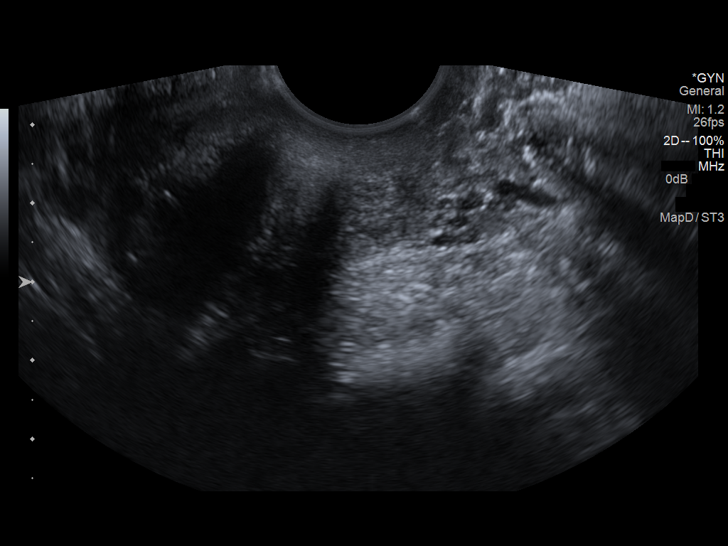
[im 47/76]
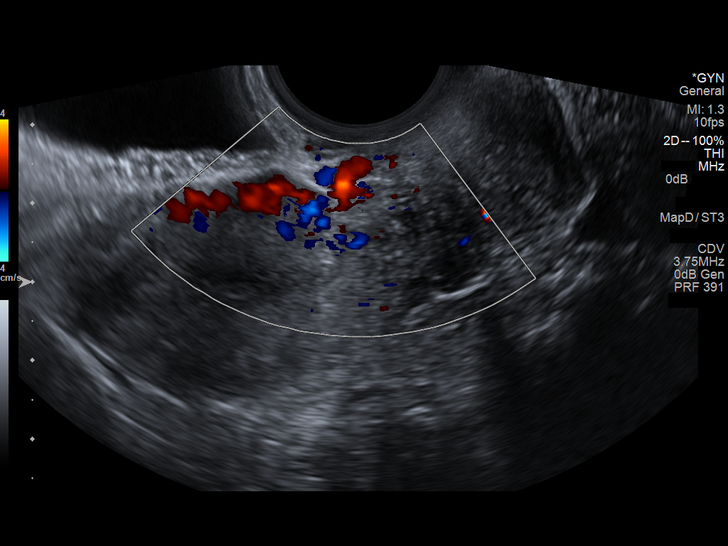
[im 51/76]
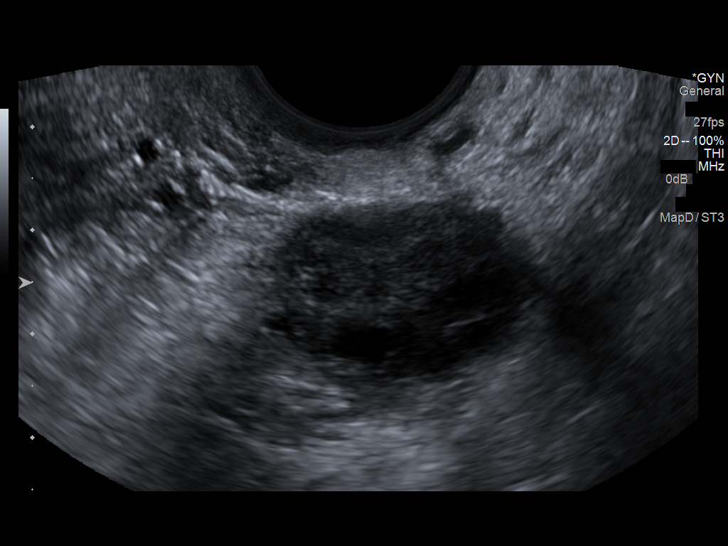
[im 57/76]
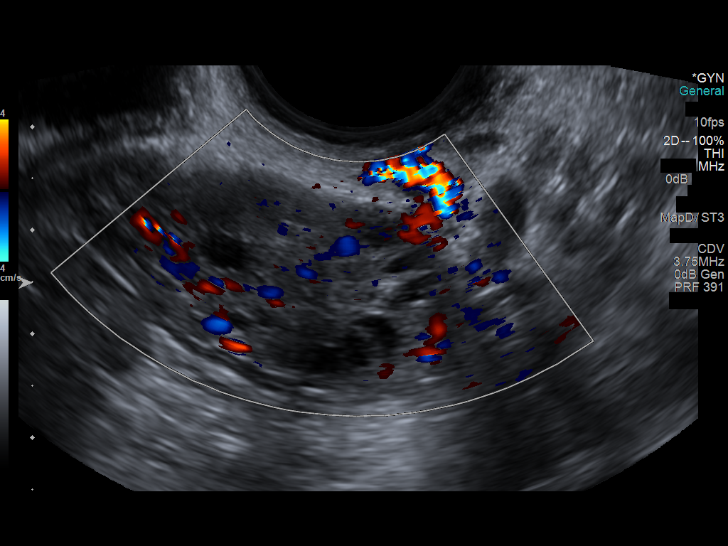
[im 63/76]
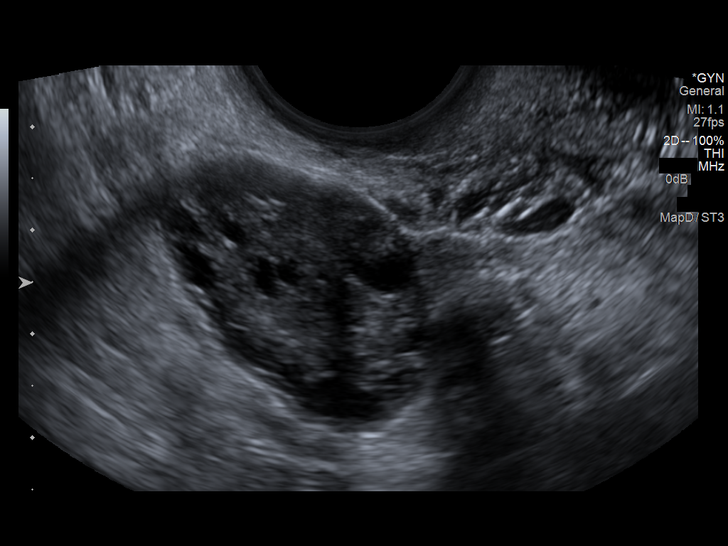
[im 69/76]
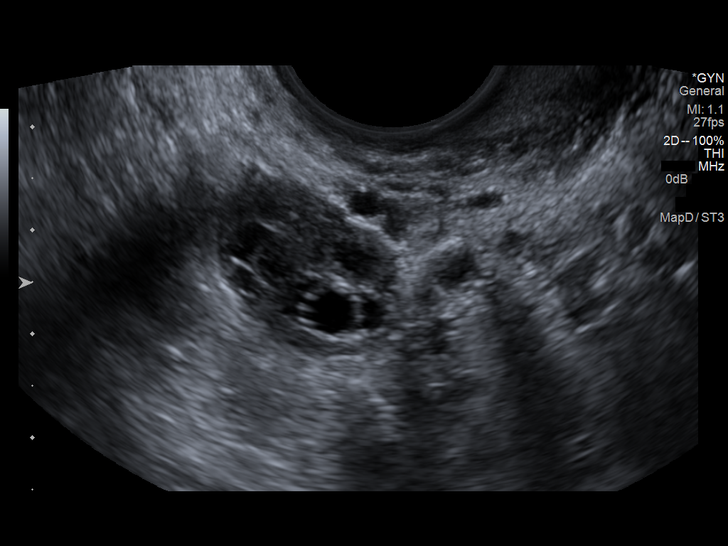
[im 76/76]
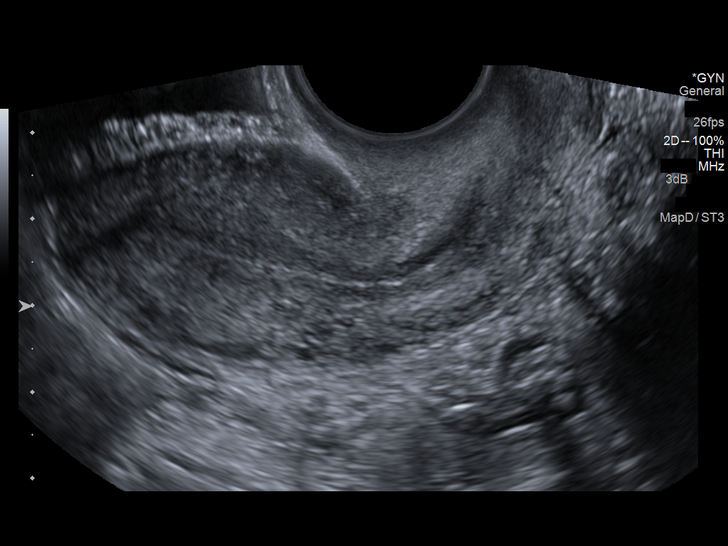

[14 of 25 positions shown; findings below may reference images not displayed]

FINDINGS: Uterus

Measurements: 6.8 cm x 2.2 cm x 3.5 cm. Mild contour bulge along the
anterior uterus appears to be from prominent vessels. No fibroids or
other mass visualized.

Endometrium

Thickness: 6.8 mm.  No focal abnormality visualized.

Right ovary

Measurements: 3.1 cm x 2.0 cm x 2.3 cm. Normal appearance/no adnexal
mass.

Left ovary

Measurements: 3.2 cm x 1.9 cm x 2.8 cm. Normal appearance/no adnexal
mass.

Other findings

No free fluid.
IMPRESSION: 1. Normal transabdominal and endovaginal pelvic ultrasound.

## 2014-10-30 ENCOUNTER — Emergency Department (HOSPITAL_COMMUNITY)
Admission: EM | Admit: 2014-10-30 | Discharge: 2014-10-31 | Disposition: A | Discharge status: Home / Self Care | Payer: BLUE CROSS/BLUE SHIELD | Attending: Emergency Medicine | Admitting: Emergency Medicine

## 2014-10-30 ENCOUNTER — Encounter (HOSPITAL_COMMUNITY): Payer: Self-pay | Admitting: Vascular Surgery

## 2014-10-30 DIAGNOSIS — B9689 Other specified bacterial agents as the cause of diseases classified elsewhere: Secondary | ICD-10-CM

## 2014-10-30 DIAGNOSIS — N760 Acute vaginitis: Secondary | ICD-10-CM | POA: Insufficient documentation

## 2014-10-30 DIAGNOSIS — Z79899 Other long term (current) drug therapy: Secondary | ICD-10-CM | POA: Insufficient documentation

## 2014-10-30 DIAGNOSIS — Z880 Allergy status to penicillin: Secondary | ICD-10-CM | POA: Insufficient documentation

## 2014-10-30 DIAGNOSIS — Z3202 Encounter for pregnancy test, result negative: Secondary | ICD-10-CM | POA: Insufficient documentation

## 2014-10-30 DIAGNOSIS — Z720 Tobacco use: Secondary | ICD-10-CM | POA: Insufficient documentation

## 2014-10-30 NOTE — ED Notes (Signed)
Pelvic cart @ bedside.  

## 2014-10-30 NOTE — ED Notes (Signed)
Pt reports to the ED for eval of malodorous vaginal odor. Denies any vaginal d/c or bleeding. Reports she has a hx of bacterial vaginosis after her menstrual cycle. LMP 10/18/14. Denies any abd pain or N/V/D. Pt A&Ox4, resp e/u, and skin warm and dry.

## 2014-10-31 LAB — RAPID HIV SCREEN (HIV 1/2 AB+AG)
HIV 1/2 Antibodies: NONREACTIVE
HIV-1 P24 Antigen - HIV24: NONREACTIVE

## 2014-10-31 LAB — POC URINE PREG, ED: Preg Test, Ur: NEGATIVE

## 2014-10-31 LAB — URINE MICROSCOPIC-ADD ON

## 2014-10-31 LAB — WET PREP, GENITAL
Trich, Wet Prep: NONE SEEN
Yeast Wet Prep HPF POC: NONE SEEN

## 2014-10-31 LAB — URINALYSIS, ROUTINE W REFLEX MICROSCOPIC
BILIRUBIN URINE: NEGATIVE
GLUCOSE, UA: NEGATIVE mg/dL
HGB URINE DIPSTICK: NEGATIVE
KETONES UR: NEGATIVE mg/dL
Leukocytes, UA: NEGATIVE
Nitrite: NEGATIVE
PROTEIN: 100 mg/dL — AB
Specific Gravity, Urine: 1.031 — ABNORMAL HIGH (ref 1.005–1.030)
Urobilinogen, UA: 0.2 mg/dL (ref 0.0–1.0)
pH: 6.5 (ref 5.0–8.0)

## 2014-10-31 LAB — GC/CHLAMYDIA PROBE AMP (~~LOC~~) NOT AT ARMC
CHLAMYDIA, DNA PROBE: NEGATIVE
NEISSERIA GONORRHEA: NEGATIVE

## 2014-10-31 LAB — HIV ANTIBODY (ROUTINE TESTING W REFLEX): HIV Screen 4th Generation wRfx: NONREACTIVE

## 2014-10-31 MED ORDER — METRONIDAZOLE 500 MG PO TABS: 500.0000 mg | ORAL_TABLET | Freq: Two times a day (BID) | ORAL | Status: DC

## 2014-10-31 NOTE — Discharge Instructions (Signed)
Please follow up with an Ob/Gyn to schedule a follow up appointment. Please take your antibiotic until completion. Please refrain from sexual activity for the next 48 hours. If you receive a call informing you of a positive test result please follow up with your doctor to receive treatment for gonorrhea and/or chlamydia. You must notify all partners within the last six months of any positive test results. Please read all discharge instructions and return precautions.    Bacterial Vaginosis Bacterial vaginosis is a vaginal infection that occurs when the normal balance of bacteria in the vagina is disrupted. It results from an overgrowth of certain bacteria. This is the most common vaginal infection in women of childbearing age. Treatment is important to prevent complications, especially in pregnant women, as it can cause a premature delivery. CAUSES  Bacterial vaginosis is caused by an increase in harmful bacteria that are normally present in smaller amounts in the vagina. Several different kinds of bacteria can cause bacterial vaginosis. However, the reason that the condition develops is not fully understood. RISK FACTORS Certain activities or behaviors can put you at an increased risk of developing bacterial vaginosis, including:  Having a new sex partner or multiple sex partners.  Douching.  Using an intrauterine device (IUD) for contraception. Women do not get bacterial vaginosis from toilet seats, bedding, swimming pools, or contact with objects around them. SIGNS AND SYMPTOMS  Some women with bacterial vaginosis have no signs or symptoms. Common symptoms include:  Grey vaginal discharge.  A fishlike odor with discharge, especially after sexual intercourse.  Itching or burning of the vagina and vulva.  Burning or pain with urination. DIAGNOSIS  Your health care provider will take a medical history and examine the vagina for signs of bacterial vaginosis. A sample of vaginal fluid may be  taken. Your health care provider will look at this sample under a microscope to check for bacteria and abnormal cells. A vaginal pH test may also be done.  TREATMENT  Bacterial vaginosis may be treated with antibiotic medicines. These may be given in the form of a pill or a vaginal cream. A second round of antibiotics may be prescribed if the condition comes back after treatment.  HOME CARE INSTRUCTIONS   Only take over-the-counter or prescription medicines as directed by your health care provider.  If antibiotic medicine was prescribed, take it as directed. Make sure you finish it even if you start to feel better.  Do not have sex until treatment is completed.  Tell all sexual partners that you have a vaginal infection. They should see their health care provider and be treated if they have problems, such as a mild rash or itching.  Practice safe sex by using condoms and only having one sex partner. SEEK MEDICAL CARE IF:   Your symptoms are not improving after 3 days of treatment.  You have increased discharge or pain.  You have a fever. MAKE SURE YOU:   Understand these instructions.  Will watch your condition.  Will get help right away if you are not doing well or get worse. FOR MORE INFORMATION  Centers for Disease Control and Prevention, Division of STD Prevention: [employer] American Sexual Health Association (ASHA): www.ashastd.org  Document Released: 02/02/2005 Document Revised: 11/23/2012 Document Reviewed: 09/14/2012 [employer] [facility] Patient Information 2015 [place], [place]. This information is not intended to replace advice given to you by your health care provider. Make sure you discuss any questions you have with your health care provider.

## 2014-10-31 NOTE — ED Provider Notes (Signed)
CSN: [id number]     Arrival date & time 10/30/14  1658 History  This chart was scribed for non-physician practitioner Francee Piccolo, PA-C working with Tomasita Crumble, MD by Lyndel Safe, ED Scribe. This patient was seen in room D34C/D34C and the patient's care was started at 12:05 AM.    Chief Complaint  Patient presents with  . Vaginal Discharge   The history is provided by the patient. No language interpreter was used.   HPI Comments: Julia Hall is a 24 y.o. female who presents to the Emergency Department complaining of a malodorous vaginal odor that she describes as a 'fishy' odor onset 7 days ago s/p menstrual cycle. Pt reports a history of bacterial vaginosis after menstrual cycles. LNMP was 14 days ago. She denies recent unprotected sex or concern of STD exposure. Also denies any vaginal discharge or bleeding, dysuria, nausea, vomiting, diarrhea, abdominal pain, PShx to abdomen, or past pregnancy.   Past Medical History  Diagnosis Date  . Seasonal allergies    History reviewed. No pertinent past surgical history. No family history on file. Social History  Substance Use Topics  . Smoking status: Current Some Day Smoker    Types: Cigarettes  . Smokeless tobacco: Never Used  . Alcohol Use: No   OB History    No data available     Review of Systems  Gastrointestinal: Negative for nausea, vomiting, abdominal pain and diarrhea.  Genitourinary: Negative for dysuria, vaginal bleeding and vaginal discharge.  All other systems reviewed and are negative.   Allergies  Penicillins  Home Medications   Prior to Admission medications   Medication Sig Start Date End Date Taking? Authorizing Provider  cetirizine (ZYRTEC) 10 MG tablet Take 10 mg by mouth daily as needed for allergies.    Yes Historical Provider, MD  triamcinolone cream (KENALOG) 0.1 % Apply 1 application topically 2 (two) times daily as needed. For eczema. 08/09/13  Yes Historical Provider, MD   metroNIDAZOLE (FLAGYL) 500 MG tablet Take 1 tablet (500 mg total) by mouth 2 (two) times daily. 10/31/14   Ilithyia Titzer, PA-C   BP 107/56 mmHg  Pulse 62  Temp(Src) 97.8 F (36.6 C) (Oral)  Resp 18  SpO2 100%  LMP 10/18/2014 (Approximate) Physical Exam  Constitutional: She is oriented to person, place, and time. She appears well-developed and well-nourished. No distress.  HENT:  Head: Normocephalic and atraumatic.  Right Ear: External ear normal.  Left Ear: External ear normal.  Nose: Nose normal.  Mouth/Throat: Oropharynx is clear and moist.  Eyes: Conjunctivae are normal.  Neck: Normal range of motion. Neck supple. No JVD present.  No nuchal rigidity.   Cardiovascular: Normal rate, regular rhythm and normal heart sounds.   Pulmonary/Chest: Effort normal and breath sounds normal. No respiratory distress.  Abdominal: Soft. Bowel sounds are normal. There is no tenderness.  Musculoskeletal: Normal range of motion.  Neurological: She is alert and oriented to person, place, and time. Coordination normal.  Skin: Skin is warm and dry. No rash noted. She is not diaphoretic. No erythema. No pallor.  Psychiatric: She has a normal mood and affect. Her behavior is normal.  Nursing note and vitals reviewed.  Exam performed by Francee Piccolo L,  exam chaperoned Date: 10/31/2014 Pelvic exam: normal external genitalia without evidence of trauma. VULVA: normal appearing vulva with no masses, tenderness or lesion. VAGINA: normal appearing vagina with normal color and discharge, no lesions. CERVIX: normal appearing cervix without lesions, cervical motion tenderness absent, cervical os closed with out  purulent discharge; vaginal discharge - white and foul, Wet prep and DNA probe for chlamydia and GC obtained.   ADNEXA: normal adnexa in size, nontender and no masses UTERUS: uterus is normal size, shape, consistency and nontender.   ED Course  Procedures  Medications - No data to  display  DIAGNOSTIC STUDIES: Oxygen Saturation is 100% on RA, normal by my interpretation.    COORDINATION OF CARE: 12:08 AM Discussed treatment plan with pt. Pt acknowledges and agrees to plan.   Labs Review Labs Reviewed  WET PREP, GENITAL - Abnormal; Notable for the following:    Clue Cells Wet Prep HPF POC MODERATE (*)    WBC, Wet Prep HPF POC FEW (*)    All other components within normal limits  URINALYSIS, ROUTINE W REFLEX MICROSCOPIC (NOT AT [facility]) - Abnormal; Notable for the following:    APPearance CLOUDY (*)    Specific Gravity, Urine 1.031 (*)    Protein, ur 100 (*)    All other components within normal limits  URINE MICROSCOPIC-ADD ON  HIV ANTIBODY (ROUTINE TESTING)  RAPID HIV SCREEN (HIV 1/2 AB+AG)  POC URINE PREG, ED  I-STAT BETA HCG BLOOD, ED ([facility], [facility], [facility] ONLY)  GC/CHLAMYDIA PROBE AMP ([place]) NOT AT [facility]    Imaging Review No results found. I have personally reviewed and evaluated these images and lab results as part of my medical decision-making.   EKG Interpretation None      MDM   Final diagnoses:  Bacterial vaginosis    Filed Vitals:   10/31/14 0154  BP: 113/72  Pulse: 71  Temp: 98.3 F (36.8 C)  Resp: 20   Afebrile, NAD, non-toxic appearing, AAOx4.   Patient to be discharged with instructions to follow up with OBGYN. Pt understands GC/Chlamydia cultures pending and that they will need to inform all sexual partners within the last 6 months if results return positive. Pt will have to see Ob/Gyn or PCP or health clinic for treatment if positive. Pt advised that she will receive a call in 48 hours if the test is positive and to refrain from sexual activity for 48 hours. If the test is positive until treated.  Pt not concerning for PID because hemodynamically stable and no cervical motion tenderness on pelvic exam. Pt has also been treated with flagyl for Bacterial Vaginosis. Pt has been advised to not drink alcohol while on this medication.    Discussed that because pt has had recent unprotected sex, might want to consider getting tested for HIV as well. Counseled pt that latex condoms are the only way to prevent against STDs or HIV.   Patient is stable at time of discharge      I personally performed the services described in this documentation, which was scribed in my presence. The recorded information has been reviewed and is accurate.     Francee Piccolo, PA-C 10/31/14 1610  Tomasita Crumble, MD 10/31/14 2031

## 2015-01-25 ENCOUNTER — Emergency Department (INDEPENDENT_AMBULATORY_CARE_PROVIDER_SITE_OTHER)
Admission: EM | Admit: 2015-01-25 | Discharge: 2015-01-25 | Disposition: A | Discharge status: Home / Self Care | Payer: Self-pay | Source: Home / Self Care | Attending: Family Medicine | Admitting: Family Medicine

## 2015-01-25 ENCOUNTER — Encounter (HOSPITAL_COMMUNITY): Payer: Self-pay | Admitting: Emergency Medicine

## 2015-01-25 ENCOUNTER — Other Ambulatory Visit (HOSPITAL_COMMUNITY)
Admission: RE | Admit: 2015-01-25 | Discharge: 2015-01-25 | Disposition: A | Discharge status: Home / Self Care | Payer: BLUE CROSS/BLUE SHIELD | Source: Ambulatory Visit | Attending: Family Medicine | Admitting: Family Medicine

## 2015-01-25 DIAGNOSIS — N760 Acute vaginitis: Secondary | ICD-10-CM

## 2015-01-25 DIAGNOSIS — Z113 Encounter for screening for infections with a predominantly sexual mode of transmission: Secondary | ICD-10-CM | POA: Insufficient documentation

## 2015-01-25 DIAGNOSIS — N898 Other specified noninflammatory disorders of vagina: Secondary | ICD-10-CM

## 2015-01-25 DIAGNOSIS — A499 Bacterial infection, unspecified: Secondary | ICD-10-CM

## 2015-01-25 DIAGNOSIS — B9689 Other specified bacterial agents as the cause of diseases classified elsewhere: Secondary | ICD-10-CM

## 2015-01-25 MED ORDER — METRONIDAZOLE 500 MG PO TABS: 500.0000 mg | ORAL_TABLET | Freq: Two times a day (BID) | ORAL | Status: DC

## 2015-01-25 NOTE — Discharge Instructions (Signed)
Bacterial Vaginosis °Bacterial vaginosis is a vaginal infection that occurs when the normal balance of bacteria in the vagina is disrupted. It results from an overgrowth of certain bacteria. This is the most common vaginal infection in women of childbearing age. Treatment is important to prevent complications, especially in pregnant women, as it can cause a premature delivery. °CAUSES  °Bacterial vaginosis is caused by an increase in harmful bacteria that are normally present in smaller amounts in the vagina. Several different kinds of bacteria can cause bacterial vaginosis. However, the reason that the condition develops is not fully understood. °RISK FACTORS °Certain activities or behaviors can put you at an increased risk of developing bacterial vaginosis, including: °· Having a new sex partner or multiple sex partners. °· Douching. °· Using an intrauterine device (IUD) for contraception. °Women do not get bacterial vaginosis from toilet seats, bedding, swimming pools, or contact with objects around them. °SIGNS AND SYMPTOMS  °Some women with bacterial vaginosis have no signs or symptoms. Common symptoms include: °· Grey vaginal discharge. °· A fishlike odor with discharge, especially after sexual intercourse. °· Itching or burning of the vagina and vulva. °· Burning or pain with urination. °DIAGNOSIS  °Your health care provider will take a medical history and examine the vagina for signs of bacterial vaginosis. A sample of vaginal fluid may be taken. Your health care provider will look at this sample under a microscope to check for bacteria and abnormal cells. A vaginal pH test may also be done.  °TREATMENT  °Bacterial vaginosis may be treated with antibiotic medicines. These may be given in the form of a pill or a vaginal cream. A second round of antibiotics may be prescribed if the condition comes back after treatment. Because bacterial vaginosis increases your risk for sexually transmitted diseases, getting  treated can help reduce your risk for chlamydia, gonorrhea, HIV, and herpes. °HOME CARE INSTRUCTIONS  °· Only take over-the-counter or prescription medicines as directed by your health care provider. °· If antibiotic medicine was prescribed, take it as directed. Make sure you finish it even if you start to feel better. °· Tell all sexual partners that you have a vaginal infection. They should see their health care provider and be treated if they have problems, such as a mild rash or itching. °· During treatment, it is important that you follow these instructions: °· Avoid sexual activity or use condoms correctly. °· Do not douche. °· Avoid alcohol as directed by your health care provider. °· Avoid breastfeeding as directed by your health care provider. °SEEK MEDICAL CARE IF:  °· Your symptoms are not improving after 3 days of treatment. °· You have increased discharge or pain. °· You have a fever. °MAKE SURE YOU:  °· Understand these instructions. °· Will watch your condition. °· Will get help right away if you are not doing well or get worse. °FOR MORE INFORMATION  °Centers for Disease Control and Prevention, Division of STD Prevention: www.cdc.gov/std °American Sexual Health Association (ASHA): www.ashastd.org  °  °This information is not intended to replace advice given to you by your health care provider. Make sure you discuss any questions you have with your health care provider. °  °Document Released: 02/02/2005 Document Revised: 02/23/2014 Document Reviewed: 09/14/2012 °Elsevier Interactive Patient Education ©2016 [employer] ° °Antibiotic Medicine °Antibiotic medicines are used to treat infections caused by bacteria. They work by hurting or killing the germs that are making you sick. °HOW WILL MY MEDICINE BE PICKED? °There are many kinds of   antibiotic medicines. To help your doctor pick one, tell your doctor if: °· You have any allergies. °· You are pregnant or plan to get pregnant. °· You are  breastfeeding. °· You are taking any medicines. These include over-the-counter medicines, prescription medicines, and herbal remedies. °· You have a medical condition or problem. °If you have questions about why your medicine was picked, ask. °FOR HOW LONG SHOULD I TAKE MY MEDICINE? °Take your medicine for as long as your doctor tells you to. Do not stop taking it when you feel better. If you stop taking it too soon: °· You may start to feel sick again. °· Your infection may get harder to treat. °· New problems may develop. °WHAT IF I MISS A DOSE? °Try not to miss any doses of antibiotic medicine. If you miss a dose: °· Take the dose as soon as you can. °· If you are taking 2 doses a day, take the next dose in 5 to 6 hours. °· If you are taking 3 or more doses a day, take the next dose in 2 to 4 hours. Then go back to the normal schedule. °If you cannot take a missed dose, take the next dose on time. Then take the missed dose after you have taken all the doses as told by your doctor, as if you had one more dose left. °DOES THIS MEDICINE AFFECT BIRTH CONTROL? °Birth control pills may not work while you are on antibiotic medicines. If you are taking birth control pills, keep taking them as usual. Use a second form of birth control, such as a condom. Keep using the second form of birth control until you are finished with your current 1 month cycle of birth control pills. °GET HELP IF: °· You get worse. °· You do not feel better a few days after starting the medicine. °· You throw up (vomit). °· There are white patches in your mouth. °· You have new joint pain after starting the medicine. °· You have new muscle aches after starting the medicine. °· You had a fever before starting the medicine, and it comes back. °· You have any symptoms of an allergic reaction, such as an itchy rash. If this happens, stop taking the medicine. °GET HELP RIGHT AWAY IF: °· Your pee (urine) turns dark or becomes blood-colored. °· Your skin  turns yellow. °· You bruise or bleed easily. °· You have very bad watery poop (diarrhea) and cramps in your belly (abdomen). °· You have a very bad headache. °· You have signs of a very bad allergic reaction, such as: °· Trouble breathing. °· Wheezing. °· Swelling of the lips, tongue, or face. °· Fainting. °· Blisters on the skin or in the mouth. °If you have signs of a very bad allergic reaction, stop taking the antibiotic medicine right away. °  °This information is not intended to replace advice given to you by your health care provider. Make sure you discuss any questions you have with your health care provider. °  °Document Released: 11/12/2007 Document Revised: 10/24/2014 Document Reviewed: 06/20/2014 °Elsevier Interactive Patient Education ©2016 [employer] ° °Vaginitis °Vaginitis is an inflammation of the vagina. It is most often caused by a change in the normal balance of the bacteria and yeast that live in the vagina. This change in balance causes an overgrowth of certain bacteria or yeast, which causes the inflammation. There are different types of vaginitis, but the most common types are: °· Bacterial vaginosis. °· Yeast infection (candidiasis). °·   Trichomoniasis vaginitis. This is a sexually transmitted infection (STI). °· Viral vaginitis. °· Atrophic vaginitis. °· Allergic vaginitis. °CAUSES  °The cause depends on the type of vaginitis. Vaginitis can be caused by: °· Bacteria (bacterial vaginosis). °· Yeast (yeast infection). °· A parasite (trichomoniasis vaginitis) °· A virus (viral vaginitis). °· Low hormone levels (atrophic vaginitis). Low hormone levels can occur during pregnancy, breastfeeding, or after menopause. °· Irritants, such as bubble baths, scented tampons, and feminine sprays (allergic vaginitis). °Other factors can change the normal balance of the yeast and bacteria that live in the vagina. These include: °· Antibiotic medicines. °· Poor hygiene. °· Diaphragms, vaginal sponges,  spermicides, birth control pills, and intrauterine devices (IUD). °· Sexual intercourse. °· Infection. °· Uncontrolled diabetes. °· A weakened immune system. °SYMPTOMS  °Symptoms can vary depending on the cause of the vaginitis. Common symptoms include: °· Abnormal vaginal discharge. °¨ The discharge is white, gray, or yellow with bacterial vaginosis. °¨ The discharge is thick, white, and cheesy with a yeast infection. °¨ The discharge is frothy and yellow or greenish with trichomoniasis. °· A bad vaginal odor. °¨ The odor is fishy with bacterial vaginosis. °· Vaginal itching, pain, or swelling. °· Painful intercourse. °· Pain or burning when urinating. °Sometimes, there are no symptoms. °TREATMENT  °Treatment will vary depending on the type of infection.  °· Bacterial vaginosis and trichomoniasis are often treated with antibiotic creams or pills. °· Yeast infections are often treated with antifungal medicines, such as vaginal creams or suppositories. °· Viral vaginitis has no cure, but symptoms can be treated with medicines that relieve discomfort. Your sexual partner should be treated as well. °· Atrophic vaginitis may be treated with an estrogen cream, pill, suppository, or vaginal ring. If vaginal dryness occurs, lubricants and moisturizing creams may help. You may be told to avoid scented soaps, sprays, or douches. °· Allergic vaginitis treatment involves quitting the use of the product that is causing the problem. Vaginal creams can be used to treat the symptoms. °HOME CARE INSTRUCTIONS  °· Take all medicines as directed by your caregiver. °· Keep your genital area clean and dry. Avoid soap and only rinse the area with water. °· Avoid douching. It can remove the healthy bacteria in the vagina. °· Do not use tampons or have sexual intercourse until your vaginitis has been treated. Use sanitary pads while you have vaginitis. °· Wipe from front to back. This avoids the spread of bacteria from the rectum to the  vagina. °· Let air reach your genital area. °¨ Wear cotton underwear to decrease moisture buildup. °¨ Avoid wearing underwear while you sleep until your vaginitis is gone. °¨ Avoid tight pants and underwear or nylons without a cotton panel. °¨ Take off wet clothing (especially bathing suits) as soon as possible. °· Use mild, non-scented products. Avoid using irritants, such as: °¨ Scented feminine sprays. °¨ Fabric softeners. °¨ Scented detergents. °¨ Scented tampons. °¨ Scented soaps or bubble baths. °· Practice safe sex and use condoms. Condoms may prevent the spread of trichomoniasis and viral vaginitis. °SEEK MEDICAL CARE IF:  °· You have abdominal pain. °· You have a fever or persistent symptoms for more than 2-3 days. °· You have a fever and your symptoms suddenly get worse. °  °This information is not intended to replace advice given to you by your health care provider. Make sure you discuss any questions you have with your health care provider. °  °Document Released: 11/30/2006 Document Revised: 06/19/2014 Document Reviewed: 07/16/2011 °Elsevier   Interactive Patient Education ©2016 Else[employer] Inc. ° °

## 2015-01-25 NOTE — ED Provider Notes (Signed)
CSN: [id number]     Arrival date & time 01/25/15  1809 History   First MD Initiated Contact with Patient 01/25/15 1854     Chief Complaint  Patient presents with  . Vaginal Discharge   (Consider location/radiation/quality/duration/timing/severity/associated sxs/prior Treatment) HPI Comments: 24 year old female presents with a vaginal discharge for 2-3 days. Denies pelvic pain or urinary symptoms.  A few days ago the patient cannot be more specific, she was treated with an antibiotic for ear infection, UTI and BV. She states she was told that this one medication would treat all of her bacterial infections. She is unsure as to what it was.   Past Medical History  Diagnosis Date  . Seasonal allergies    History reviewed. No pertinent past surgical history. No family history on file. Social History  Substance Use Topics  . Smoking status: Current Some Day Smoker    Types: Cigarettes  . Smokeless tobacco: Never Used  . Alcohol Use: No   OB History    No data available     Review of Systems  Constitutional: Negative for fever and activity change.  Respiratory: Negative.   Genitourinary: Positive for vaginal discharge. Negative for dysuria, urgency, frequency, flank pain and pelvic pain.  Musculoskeletal: Negative.     Allergies  Penicillins  Home Medications   Prior to Admission medications   Medication Sig Start Date End Date Taking? Authorizing Provider  cetirizine (ZYRTEC) 10 MG tablet Take 10 mg by mouth daily as needed for allergies.     Historical Provider, MD  metroNIDAZOLE (FLAGYL) 500 MG tablet Take 1 tablet (500 mg total) by mouth 2 (two) times daily. 10/31/14   Jennifer Piepenbrink, PA-C  triamcinolone cream (KENALOG) 0.1 % Apply 1 application topically 2 (two) times daily as needed. For eczema. 08/09/13   Historical Provider, MD   Meds Ordered and Administered this Visit  Medications - No data to display  BP 134/85 mmHg  Pulse 90  Temp(Src) 98.6 F (37 C)  (Oral)  Resp 16  SpO2 99%  LMP 01/06/2015 No data found.   Physical Exam  Constitutional: She appears well-developed and well-nourished. No distress.  Neck: Normal range of motion. Neck supple.  Cardiovascular: Normal rate.   Pulmonary/Chest: Effort normal. No respiratory distress.  Genitourinary: Vaginal discharge found.  Normal external female genitalia Thin, white discharge coating the vaginal walls. Cervix cannot be located due to redundant vaginal walls and body habitus.  Neurological: She is alert.  Skin: Skin is warm and dry.    ED Course  Procedures (including critical care time)  Labs Review Labs Reviewed  CERVICOVAGINAL ANCILLARY ONLY      Imaging Review No results found.   Visual Acuity Review  Right Eye Distance:   Left Eye Distance:   Bilateral Distance:    Right Eye Near:   Left Eye Near:    Bilateral Near:         MDM   1. Vaginal discharge   2. BV (bacterial vaginosis)    Cervical cytology pending flagy bid     Hayden Rasmussenavid Mckinsley Koelzer, NP 01/25/15 Ernest[id number]

## 2015-01-25 NOTE — ED Notes (Signed)
C/o a brown vag d/c onset 3 days associated w/a "fishy odor" A&O x4... No acute distress.

## 2015-01-28 LAB — CERVICOVAGINAL ANCILLARY ONLY
Chlamydia: NEGATIVE
Neisseria Gonorrhea: NEGATIVE
Wet Prep (BD Affirm): POSITIVE — AB

## 2015-01-28 NOTE — ED Notes (Signed)
Final report of STD testing negative; still waiting in wet prep report

## 2015-02-17 DIAGNOSIS — M3214 Glomerular disease in systemic lupus erythematosus: Secondary | ICD-10-CM

## 2015-02-17 DIAGNOSIS — R809 Proteinuria, unspecified: Secondary | ICD-10-CM

## 2015-02-17 HISTORY — DX: Proteinuria, unspecified: R80.9

## 2015-02-17 HISTORY — DX: Glomerular disease in systemic lupus erythematosus: M32.14

## 2015-03-14 ENCOUNTER — Encounter (HOSPITAL_COMMUNITY): Payer: Self-pay | Admitting: *Deleted

## 2015-03-14 ENCOUNTER — Emergency Department (HOSPITAL_COMMUNITY)
Admission: EM | Admit: 2015-03-14 | Discharge: 2015-03-15 | Disposition: A | Discharge status: Home / Self Care | Payer: BLUE CROSS/BLUE SHIELD | Attending: Emergency Medicine | Admitting: Emergency Medicine

## 2015-03-14 DIAGNOSIS — F17210 Nicotine dependence, cigarettes, uncomplicated: Secondary | ICD-10-CM | POA: Insufficient documentation

## 2015-03-14 DIAGNOSIS — Z880 Allergy status to penicillin: Secondary | ICD-10-CM | POA: Diagnosis not present

## 2015-03-14 DIAGNOSIS — R51 Headache: Secondary | ICD-10-CM | POA: Diagnosis not present

## 2015-03-14 DIAGNOSIS — R110 Nausea: Secondary | ICD-10-CM | POA: Diagnosis not present

## 2015-03-14 DIAGNOSIS — R519 Headache, unspecified: Secondary | ICD-10-CM

## 2015-03-14 MED ORDER — PROCHLORPERAZINE EDISYLATE 5 MG/ML IJ SOLN
10.0000 mg | Freq: Four times a day (QID) | INTRAMUSCULAR | Status: DC | PRN
  Administered 2015-03-14: 10 mg via INTRAVENOUS
  Filled 2015-03-14: qty 2

## 2015-03-14 MED ORDER — KETOROLAC TROMETHAMINE 15 MG/ML IJ SOLN
15.0000 mg | Freq: Once | INTRAMUSCULAR | Status: AC
  Administered 2015-03-14: 15 mg via INTRAVENOUS
  Filled 2015-03-14: qty 1

## 2015-03-14 MED ORDER — DIPHENHYDRAMINE HCL 50 MG/ML IJ SOLN
25.0000 mg | Freq: Once | INTRAMUSCULAR | Status: AC
  Administered 2015-03-14: 25 mg via INTRAVENOUS
  Filled 2015-03-14: qty 1

## 2015-03-14 MED ORDER — SODIUM CHLORIDE 0.9 % IV BOLUS (SEPSIS)
1000.0000 mL | Freq: Once | INTRAVENOUS | Status: AC
  Administered 2015-03-14: 1000 mL via INTRAVENOUS

## 2015-03-14 NOTE — Discharge Instructions (Signed)

## 2015-03-14 NOTE — ED Notes (Signed)
Pt complains of migraine for the past 2 days. Pt states she is sensitive to sound. Pt has not taken OTC medicine for migraine. Pt states she also has mild nausea.

## 2015-03-15 NOTE — ED Notes (Signed)
Patient d/c'd self care.  F/u reviewed with patient.  Patient verbalized understanding.

## 2015-03-20 NOTE — ED Provider Notes (Signed)
CSN: [id number]     Arrival date & time 03/14/15  2026 History   First MD Initiated Contact with Patient 03/14/15 2210     Chief Complaint  Patient presents with  . Migraine     (Consider location/radiation/quality/duration/timing/severity/associated sxs/prior Treatment) HPI   25 year old female with headache. She has a past history of what she calls migraine headaches.  Current symptoms feel somewhat her prior headaches. Most recently, began having a headache approximately 2 days ago. Denies any trauma.Headache is diffuse and pounding. Worsening right temporal region.Reports photosensitivity. No fevers or chills. Mild nausea, but no vomiting. No neck pain or neck stiffness. Denies any change in visual acuity. No acute numbness, tingling or focal loss of strength. Denies use of blood thinning medication. Has tried over-the-counter medication with no significant improvement.  Past Medical History  Diagnosis Date  . Seasonal allergies    History reviewed. No pertinent past surgical history. No family history on file. Social History  Substance Use Topics  . Smoking status: Current Some Day Smoker    Types: Cigarettes  . Smokeless tobacco: Never Used  . Alcohol Use: No   OB History    No data available     Review of Systems  All systems reviewed and negative, other than as noted in HPI.   Allergies  Penicillins  Home Medications   Prior to Admission medications   Medication Sig Start Date End Date Taking? Authorizing Provider  cetirizine (ZYRTEC) 10 MG tablet Take 10 mg by mouth daily as needed for allergies.    Yes Historical Provider, MD  triamcinolone cream (KENALOG) 0.1 % Apply 1 application topically 2 (two) times daily as needed. For eczema. 08/09/13  Yes Historical Provider, MD  metroNIDAZOLE (FLAGYL) 500 MG tablet Take 1 tablet (500 mg total) by mouth 2 (two) times daily. X 7 days Patient not taking: Reported on 03/14/2015 01/25/15   Hayden Rasmussen, NP   BP 129/62 mmHg   Pulse 69  Temp(Src) 98.3 F (36.8 C) (Oral)  Resp 16  SpO2 100%  LMP 02/07/2015 Physical Exam  Constitutional: She is oriented to person, place, and time. She appears well-developed and well-nourished. No distress.  HENT:  Head: Normocephalic and atraumatic.  Eyes: Conjunctivae and EOM are normal. Pupils are equal, round, and reactive to light. Right eye exhibits no discharge. Left eye exhibits no discharge.  Neck: Neck supple.  No nuchal rigidity  Cardiovascular: Normal rate, regular rhythm and normal heart sounds.  Exam reveals no gallop and no friction rub.   No murmur heard. Pulmonary/Chest: Effort normal and breath sounds normal. No respiratory distress.  Abdominal: Soft. She exhibits no distension. There is no tenderness.  Musculoskeletal: She exhibits no edema or tenderness.  Neurological: She is alert and oriented to person, place, and time. No cranial nerve deficit. She exhibits normal muscle tone. Coordination normal.  Good finger to nose testing bilaterally. Gait is steady.  Skin: Skin is warm and dry.  Psychiatric: She has a normal mood and affect. Her behavior is normal. Thought content normal.  Nursing note and vitals reviewed.   ED Course  Procedures (including critical care time) Labs Review Labs Reviewed - No data to display  Imaging Review No results found. I have personally reviewed and evaluated these images and lab results as part of my medical decision-making.   EKG Interpretation None      MDM   Final diagnoses:  Nonintractable headache, unspecified chronicity pattern, unspecified headache type    24yF with headache. No concerning "  red flags." Treated symptomatically with much improvement of symptoms. She is afebrile. No nuchal rigidity. Denies any trauma. No blood thinners. Nonfocal neuro exam.    Raeford Razor, MD 03/20/15 1151

## 2015-06-30 ENCOUNTER — Emergency Department (HOSPITAL_COMMUNITY)
Admission: EM | Admit: 2015-06-30 | Discharge: 2015-07-01 | Disposition: A | Discharge status: Home / Self Care | Payer: BLUE CROSS/BLUE SHIELD | Attending: Emergency Medicine | Admitting: Emergency Medicine

## 2015-06-30 ENCOUNTER — Encounter (HOSPITAL_COMMUNITY): Payer: Self-pay | Admitting: Emergency Medicine

## 2015-06-30 DIAGNOSIS — Z3A12 12 weeks gestation of pregnancy: Secondary | ICD-10-CM | POA: Diagnosis not present

## 2015-06-30 DIAGNOSIS — Z880 Allergy status to penicillin: Secondary | ICD-10-CM | POA: Insufficient documentation

## 2015-06-30 DIAGNOSIS — Z87891 Personal history of nicotine dependence: Secondary | ICD-10-CM | POA: Diagnosis not present

## 2015-06-30 DIAGNOSIS — R110 Nausea: Secondary | ICD-10-CM | POA: Diagnosis not present

## 2015-06-30 DIAGNOSIS — R319 Hematuria, unspecified: Secondary | ICD-10-CM | POA: Diagnosis not present

## 2015-06-30 DIAGNOSIS — Z8709 Personal history of other diseases of the respiratory system: Secondary | ICD-10-CM | POA: Insufficient documentation

## 2015-06-30 DIAGNOSIS — N939 Abnormal uterine and vaginal bleeding, unspecified: Secondary | ICD-10-CM

## 2015-06-30 DIAGNOSIS — O209 Hemorrhage in early pregnancy, unspecified: Secondary | ICD-10-CM | POA: Insufficient documentation

## 2015-06-30 NOTE — ED Notes (Signed)
Ob nurse was called and she states that patient is 12 weeks and they come out for over 20 weeks.

## 2015-06-30 NOTE — ED Notes (Signed)
Patient is 3 months pregnant and has been spotting since Friday. Patient states that today when she wiped it was still a little bit of blood. Patient is worried she is having a miscarriage. Patient is not having any pain currently.

## 2015-07-01 LAB — URINE MICROSCOPIC-ADD ON
BACTERIA UA: NONE SEEN
RBC / HPF: NONE SEEN RBC/hpf (ref 0–5)

## 2015-07-01 LAB — URINALYSIS, ROUTINE W REFLEX MICROSCOPIC
Bilirubin Urine: NEGATIVE
GLUCOSE, UA: NEGATIVE mg/dL
KETONES UR: NEGATIVE mg/dL
Nitrite: NEGATIVE
PH: 6 (ref 5.0–8.0)
PROTEIN: NEGATIVE mg/dL
SPECIFIC GRAVITY, URINE: 1.016 (ref 1.005–1.030)

## 2015-07-01 LAB — HCG, QUANTITATIVE, PREGNANCY: hCG, Beta Chain, Quant, S: 43127 m[IU]/mL — ABNORMAL HIGH (ref <5)

## 2015-07-01 NOTE — ED Provider Notes (Signed)
CSN: [id number]     Arrival date & time 06/30/15  2326 History  By signing my name below, I, [facility], attest that this documentation has been prepared under the direction and in the presence of Eber Hong, MD. Electronically Signed: Randell Patient, ED Scribe. 07/01/2015. 2:22 AM.   Chief Complaint  Patient presents with  . Vaginal Bleeding   The history is provided by the patient. No language interpreter was used.   HPI Comments: Julia Hall is a [redacted] weeks pregnant  25 y.o. female who presents to the Emergency Department complaining of intermittent, mild hematuria onset earlier today. Pt states that she noticed blood in the toilet and on the toilet paper when she wiped herself after urinating earlier today. She reports nausea for the past 2 months and white vaginal discharge. Per pt, she had been evaluated at an [facility] office who stated that her pregnancy was progressing normally but that was prior to onset of bleeding. She notes that she was diagnosed with a UTI shortly after finding out she was pregnant but was treated with an antibiotic. She has had one abortion in the past but was only one month along during this previous pregnancy. Denies family hx of miscarriages. Denies hx of chronic conditions and states that she is otherwise healthy. Denies diarrhea, abdominal pain, dysuria, back pain, vomiting, vaginal bleeding. Q2V9D6  Past Medical History  Diagnosis Date  . Seasonal allergies    History reviewed. No pertinent past surgical history. No family history on file. Social History  Substance Use Topics  . Smoking status: Former Smoker    Types: Cigarettes  . Smokeless tobacco: Never Used  . Alcohol Use: No   OB History    No data available     Review of Systems  Gastrointestinal: Positive for nausea. Negative for vomiting, abdominal pain and diarrhea.  Genitourinary: Positive for hematuria and vaginal discharge. Negative for dysuria and vaginal bleeding.   Musculoskeletal: Negative for back pain.   Allergies  Penicillins  Home Medications   Prior to Admission medications   Medication Sig Start Date End Date Taking? Authorizing Provider  cetirizine (ZYRTEC) 10 MG tablet Take 10 mg by mouth daily as needed for allergies.    Yes Historical Provider, MD  metroNIDAZOLE (FLAGYL) 500 MG tablet Take 1 tablet (500 mg total) by mouth 2 (two) times daily. X 7 days Patient not taking: Reported on 03/14/2015 01/25/15   Hayden Rasmussen, NP  triamcinolone cream (KENALOG) 0.1 % Apply 1 application topically 2 (two) times daily as needed. For eczema. 08/09/13   Historical Provider, MD   BP 124/75 mmHg  Pulse 86  Temp(Src) 98.1 F (36.7 C) (Oral)  Resp 16  Ht  (1.676 m)  Wt 190 lb 0.6 oz (86.2 kg)  BMI 30.69 kg/m2  SpO2 100% Physical Exam  Constitutional: She appears well-developed and well-nourished. No distress.  HENT:  Head: Normocephalic and atraumatic.  Mouth/Throat: Oropharynx is clear and moist. No oropharyngeal exudate.  Eyes: Conjunctivae and EOM are normal. Pupils are equal, round, and reactive to light. Right eye exhibits no discharge. Left eye exhibits no discharge. No scleral icterus.  Neck: Normal range of motion. Neck supple. No JVD present. No thyromegaly present.  Cardiovascular: Normal rate, regular rhythm, normal heart sounds and intact distal pulses.  Exam reveals no gallop and no friction rub.   No murmur heard. Pulmonary/Chest: Effort normal and breath sounds normal. No respiratory distress. She has no wheezes. She has no rales.  Abdominal: Soft. Bowel  sounds are normal. She exhibits no distension and no mass. There is no tenderness.  Genitourinary:  Discussed with pt and deferred vaginal exam.  Musculoskeletal: Normal range of motion. She exhibits no edema or tenderness.  Lymphadenopathy:    She has no cervical adenopathy.  Neurological: She is alert. Coordination normal.  Skin: Skin is warm and dry. No rash noted. No  erythema.  Psychiatric: She has a normal mood and affect. Her behavior is normal.  Nursing note and vitals reviewed.   ED Course  Procedures  DIAGNOSTIC STUDIES: Oxygen Saturation is 99% on RA, normal by my interpretation.    COORDINATION OF CARE: 1:17 AM Performed US of abdomen. Will order labs. Discussed treatment plan with pt at bedside and pt agreed to plan.   Labs Review Labs Reviewed  HCG, QUANTITATIVE, PREGNANCY - Abnormal; Notable for the following:    hCG, Beta Chain, Quant, S [id number] (*)    All other components within normal limits  URINALYSIS, ROUTINE W REFLEX MICROSCOPIC (NOT AT [facility]) - Abnormal; Notable for the following:    APPearance CLOUDY (*)    Hgb urine dipstick TRACE (*)    Leukocytes, UA MODERATE (*)    All other components within normal limits  URINE MICROSCOPIC-ADD ON - Abnormal; Notable for the following:    Squamous Epithelial / LPF 6-30 (*)    All other components within normal limits  URINE CULTURE   I have personally reviewed and evaluated these lab results as part of my medical decision-making.    MDM   Final diagnoses:  Vaginal bleeding    I personally performed the services described in this documentation, which was scribed in my presence. The recorded information has been reviewed and is accurate.    ua neg No vag exam indicated - has had recent OB eval Bedside us reveals normal FHR.   Stable for d/c.  Pt to f/u in the next week with OBGYN No abd pain She is in agreement  Eber HongBrian Hutton [place], MD 07/01/15 0231

## 2015-07-01 NOTE — Discharge Instructions (Signed)
Your urine test was normal See your doctor for increased bleeding

## 2015-07-02 LAB — URINE CULTURE

## 2015-07-03 ENCOUNTER — Telehealth: Payer: Self-pay | Admitting: *Deleted

## 2015-07-03 NOTE — ED Notes (Signed)
Post ED Visit - Positive Culture Follow-up: Unsuccessful Patient Follow-up  Culture assessed and recommendations reviewed by: []  Enzo BiNathan Batchelder, Pharm.D. []  Celedonio MiyamotoJeremy Frens, Pharm.D., BCPS []  Garvin FilaMike Maccia, Pharm.D. []  Georgina PillionElizabeth Martin, Pharm.D., BCPS []  BowlesMinh Pham, [address]Ph[address]D., BCPS, AAHIVP []  Estella HuskMichelle Turner, Pharm.D., BCPS, AAHIVP []  Tennis Mustassie Stewart, Pharm.D. []  Sherle Poeob Vincent, [address]Pharm[address] Fayrene HelperBowie Tran, PA-C  Positive Urine culture  [x]  Patient discharged without antimicrobial prescription and treatment is now indicated []  Organism is resistant to prescribed ED discharge antimicrobial []  Patient with positive blood cultures   Unable to contact patient after 3 attempts, letter will be sent to address on file  Lysle PearlRobertson, Dejour Vos Talley 07/03/2015, 10:19 AM

## 2015-07-03 NOTE — Progress Notes (Signed)
ED Antimicrobial Stewardship Positive Culture Follow Up   Fawn KirkLetitia Julio is an 25 y.o. female who [facility] with a chief complaint of  Chief Complaint  Patient presents with  . Vaginal Bleeding    Recent Results (from the past 720 hour(s))  Urine culture     Status: Abnormal   Collection Time: 07/01/15  1:38 AM  Result Value Ref Range Status   Specimen Description URINE, CLEAN CATCH  Final   Special Requests NONE  Final   Culture (A)  Final    MULTIPLE SPECIES PRESENT, SUGGEST RECOLLECTION 2,000 COLONIES/mL GROUP B STREP(S.AGALACTIAE)ISOLATED TESTING AGAINST S. AGALACTIAE NOT ROUTINELY PERFORMED DUE TO PREDICTABILITY OF AMP/PEN/VAN SUSCEPTIBILITY. [facility]    Report Status 07/02/2015 FINAL  Final    Patient discharged without any antibiotics and treatment is now indicated.   New antibiotic prescription: Cephalexin 500 mg PO BID x 7 days  ED Provider: Fayrene HelperBowie Tran, PA-C  Cassie L. Roseanne RenoStewart, PharmD PGY2 Infectious Diseases Pharmacy Resident Pager: [phone] 07/03/2015 9:25 AM

## 2015-09-18 IMAGING — CR DG CHEST 2V
2 series · 2 of 2 positions shown · non-contrast
Comparison: None.

CLINICAL DATA: Right chest pain starting 4 hr ago

EXAM:
CHEST  2 VIEW

[w chest pa]
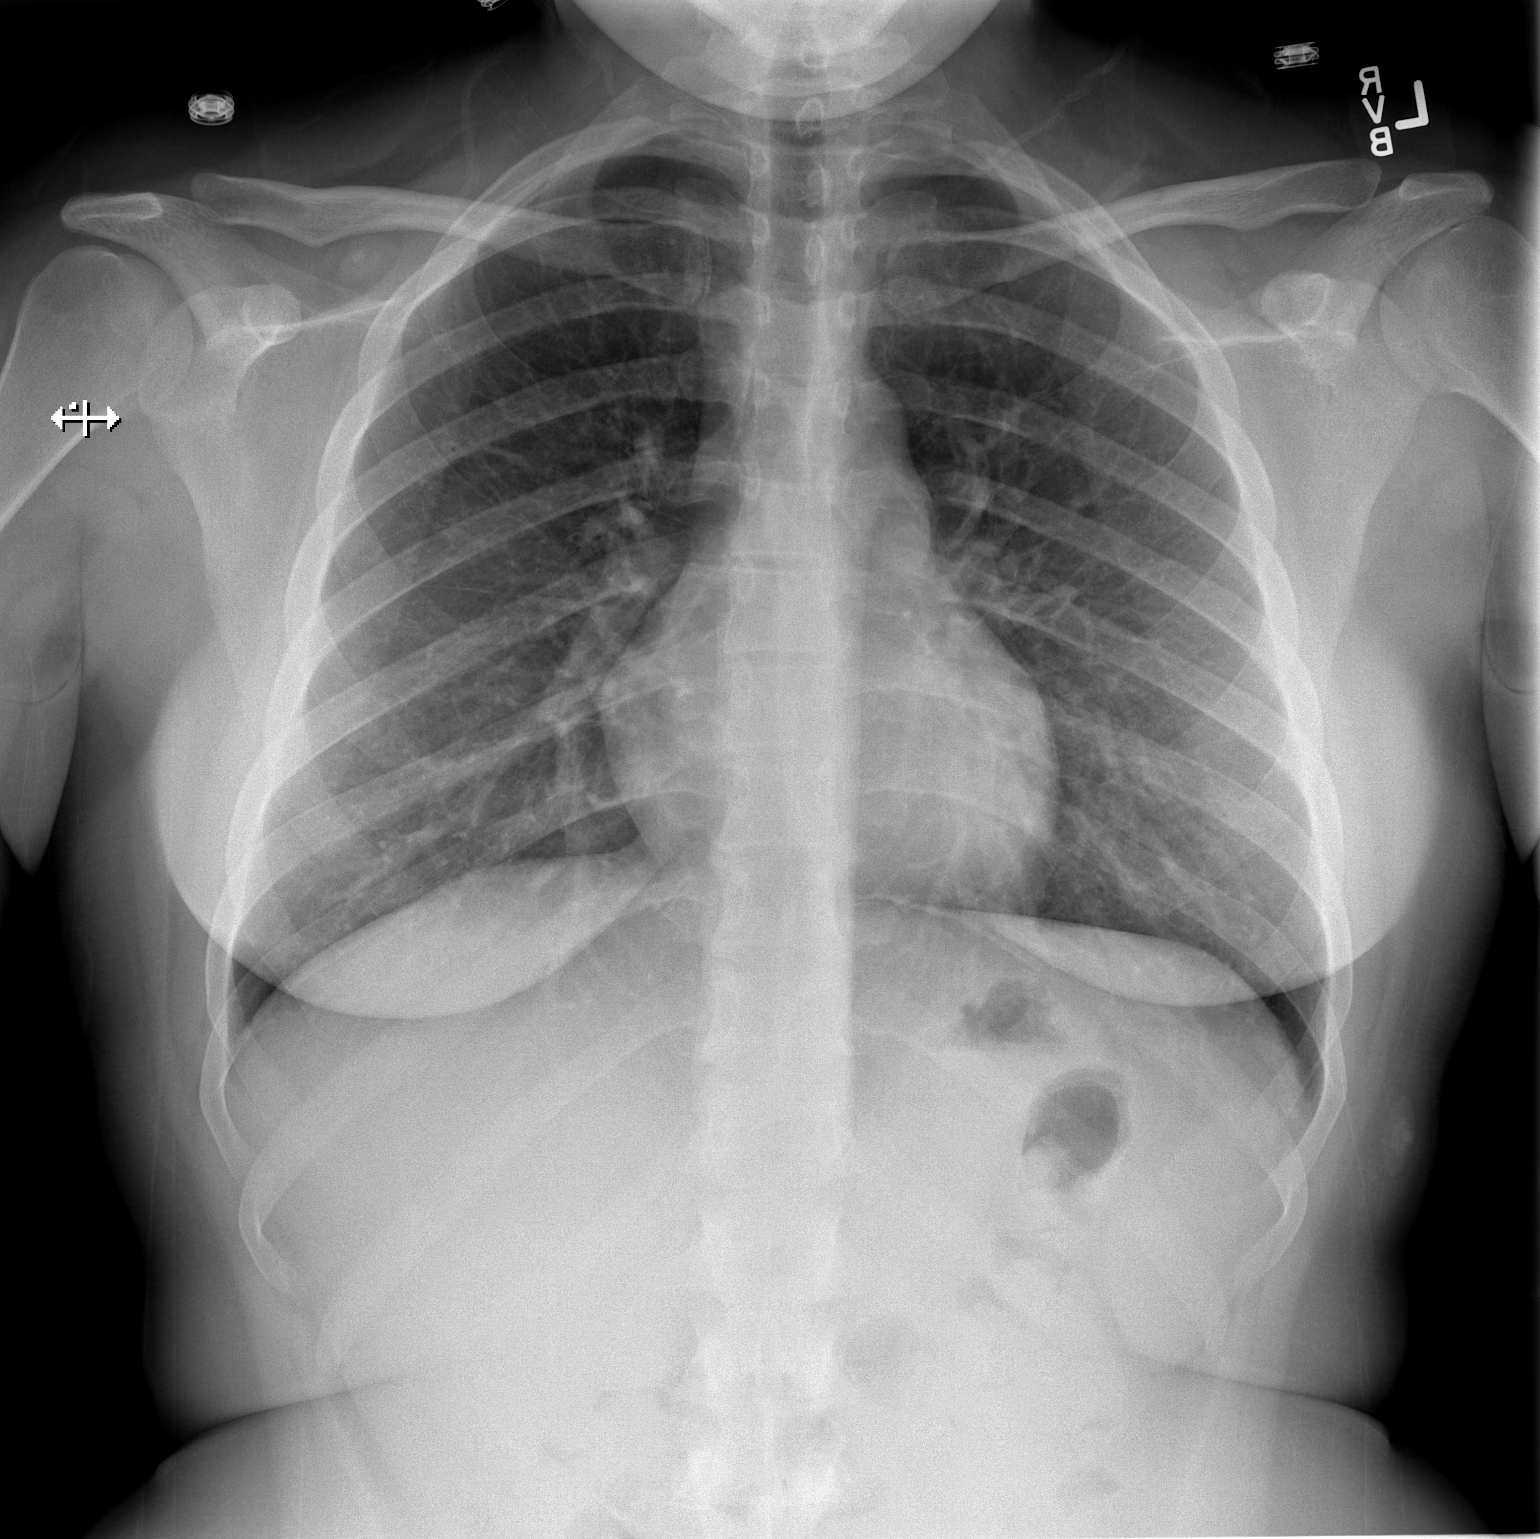

[w chest lat]
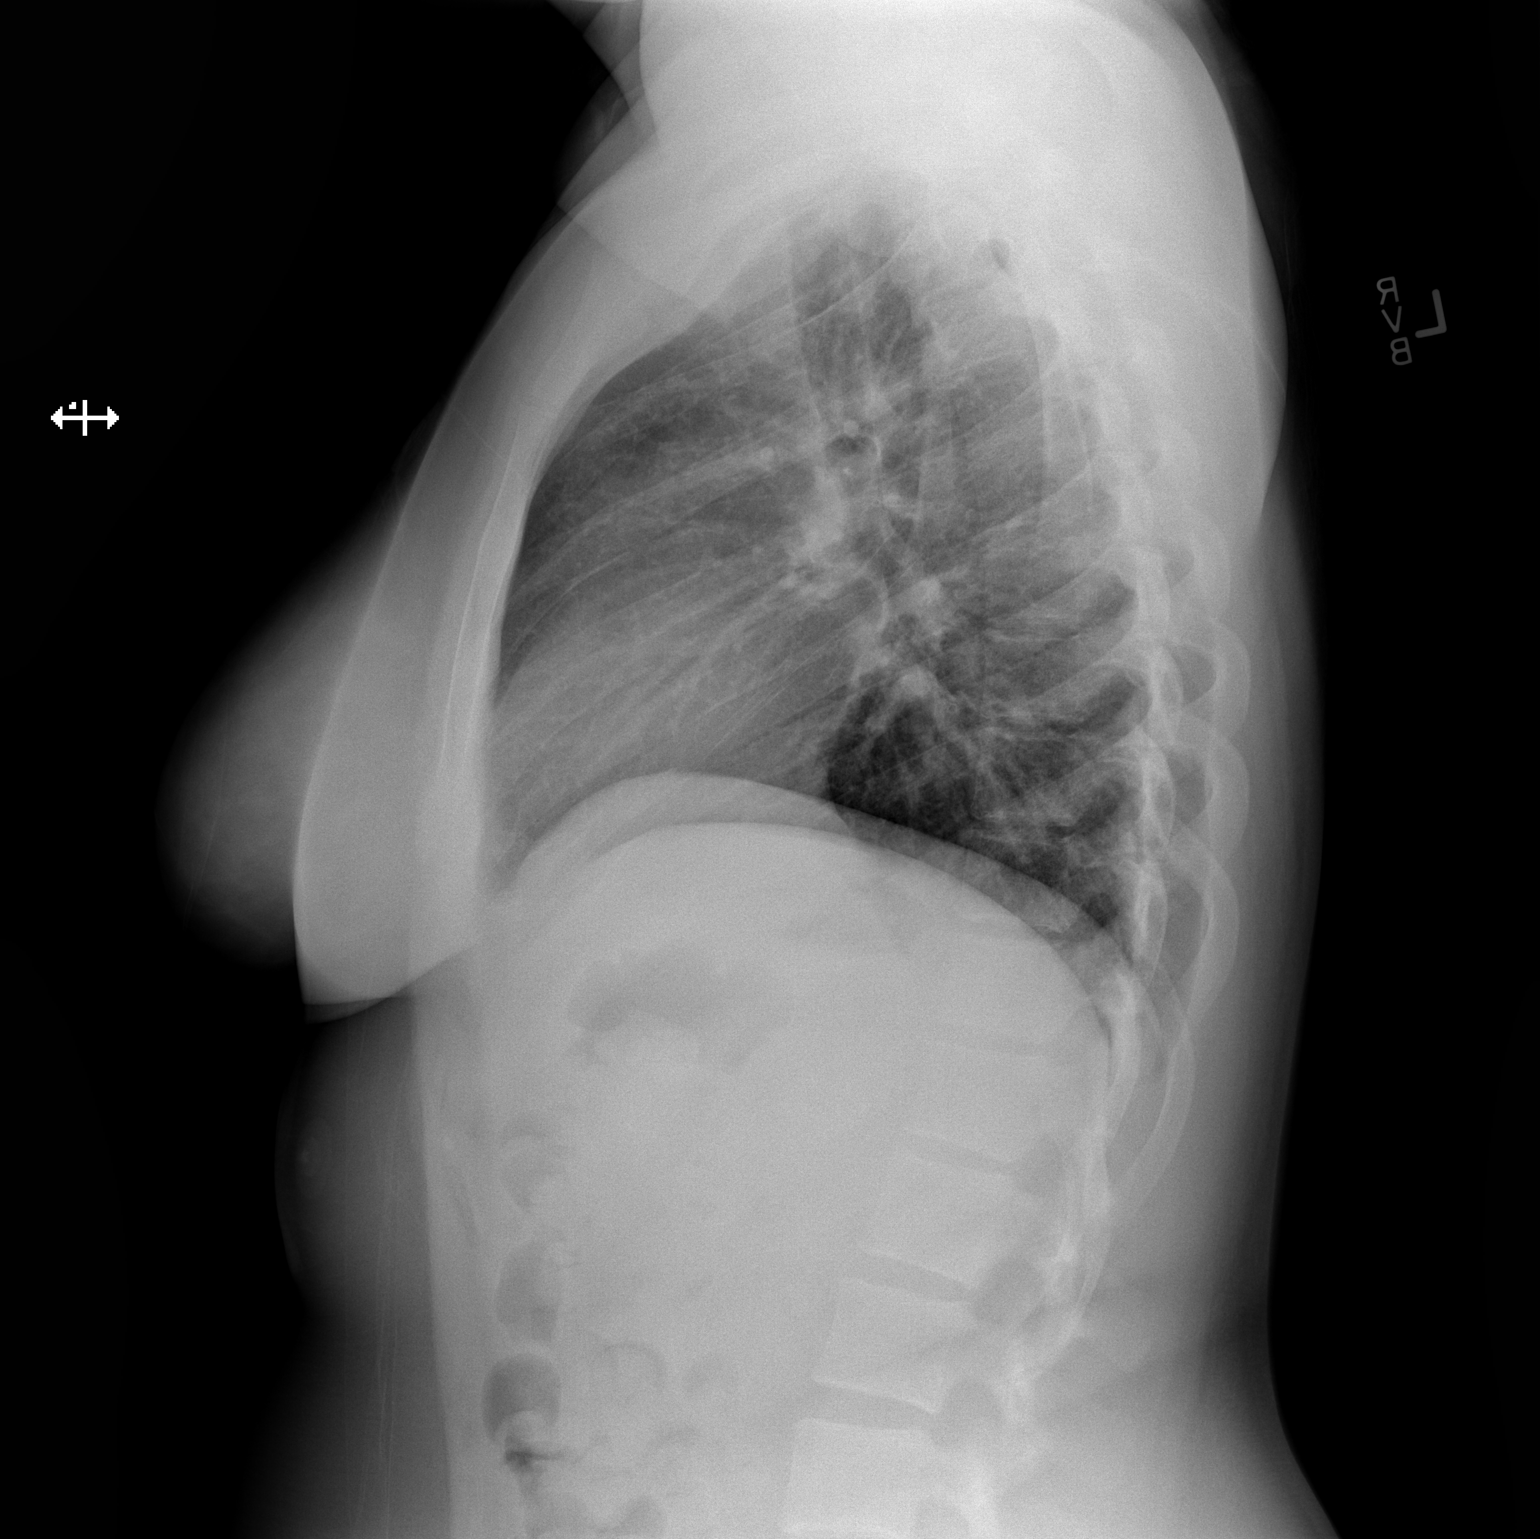

[2 of 2 positions shown; findings below may reference images not displayed]

FINDINGS: Cardiomediastinal silhouette is unremarkable. No acute infiltrate or
pleural effusion. No pulmonary edema. Bony thorax is unremarkable.
IMPRESSION: No active cardiopulmonary disease.

## 2015-11-28 ENCOUNTER — Other Ambulatory Visit: Payer: Self-pay | Admitting: Nephrology

## 2015-12-31 ENCOUNTER — Inpatient Hospital Stay (HOSPITAL_COMMUNITY)
Admission: AD | Admit: 2015-12-31 | Payer: BLUE CROSS/BLUE SHIELD | Source: Ambulatory Visit | Admitting: Obstetrics and Gynecology

## 2016-01-23 ENCOUNTER — Other Ambulatory Visit (HOSPITAL_COMMUNITY): Payer: Self-pay | Admitting: Nephrology

## 2016-01-23 DIAGNOSIS — R809 Proteinuria, unspecified: Secondary | ICD-10-CM

## 2016-02-19 ENCOUNTER — Other Ambulatory Visit: Payer: Self-pay | Admitting: Radiology

## 2016-02-20 ENCOUNTER — Other Ambulatory Visit: Payer: Self-pay | Admitting: General Surgery

## 2016-02-20 ENCOUNTER — Other Ambulatory Visit: Payer: Self-pay | Admitting: Student

## 2016-02-21 ENCOUNTER — Encounter (HOSPITAL_COMMUNITY): Payer: Self-pay

## 2016-02-21 ENCOUNTER — Ambulatory Visit (HOSPITAL_COMMUNITY)
Admission: RE | Admit: 2016-02-21 | Discharge: 2016-02-21 | Disposition: A | Discharge status: Home / Self Care | Payer: Medicaid Other | Source: Ambulatory Visit | Attending: Nephrology | Admitting: Nephrology

## 2016-02-21 DIAGNOSIS — R809 Proteinuria, unspecified: Secondary | ICD-10-CM

## 2016-02-21 DIAGNOSIS — N059 Unspecified nephritic syndrome with unspecified morphologic changes: Secondary | ICD-10-CM | POA: Insufficient documentation

## 2016-02-21 DIAGNOSIS — Z87891 Personal history of nicotine dependence: Secondary | ICD-10-CM | POA: Diagnosis not present

## 2016-02-21 DIAGNOSIS — Z79899 Other long term (current) drug therapy: Secondary | ICD-10-CM | POA: Diagnosis not present

## 2016-02-21 DIAGNOSIS — M3214 Glomerular disease in systemic lupus erythematosus: Secondary | ICD-10-CM | POA: Insufficient documentation

## 2016-02-21 DIAGNOSIS — Z880 Allergy status to penicillin: Secondary | ICD-10-CM | POA: Diagnosis not present

## 2016-02-21 DIAGNOSIS — M329 Systemic lupus erythematosus, unspecified: Secondary | ICD-10-CM | POA: Diagnosis not present

## 2016-02-21 LAB — PROTIME-INR
INR: 0.95
Prothrombin Time: 12.7 seconds (ref 11.4–15.2)

## 2016-02-21 LAB — CBC
HCT: 39.6 % (ref 36.0–46.0)
Hemoglobin: 13.4 g/dL (ref 12.0–15.0)
MCH: 27.9 pg (ref 26.0–34.0)
MCHC: 33.8 g/dL (ref 30.0–36.0)
MCV: 82.3 fL (ref 78.0–100.0)
PLATELETS: 207 10*3/uL (ref 150–400)
RBC: 4.81 MIL/uL (ref 3.87–5.11)
RDW: 13.5 % (ref 11.5–15.5)
WBC: 5 10*3/uL (ref 4.0–10.5)

## 2016-02-21 LAB — APTT: APTT: 26 s (ref 24–36)

## 2016-02-21 MED ORDER — ACETAMINOPHEN 325 MG PO TABS
650.0000 mg | ORAL_TABLET | Freq: Once | ORAL | Status: AC
  Administered 2016-02-21: 650 mg via ORAL
  Filled 2016-02-21: qty 2

## 2016-02-21 MED ORDER — SODIUM CHLORIDE 0.9 % IV SOLN
INTRAVENOUS | Status: AC | PRN
  Administered 2016-02-21: 10 mL/h via INTRAVENOUS

## 2016-02-21 MED ORDER — ACETAMINOPHEN 325 MG PO TABS
ORAL_TABLET | ORAL | Status: AC
  Administered 2016-02-21: 650 mg via ORAL
  Filled 2016-02-21: qty 2

## 2016-02-21 MED ORDER — MIDAZOLAM HCL 2 MG/2ML IJ SOLN
INTRAMUSCULAR | Status: AC
  Filled 2016-02-21: qty 2

## 2016-02-21 MED ORDER — SODIUM CHLORIDE 0.9 % IV SOLN: INTRAVENOUS | Status: DC

## 2016-02-21 MED ORDER — GELATIN ABSORBABLE 12-7 MM EX MISC
CUTANEOUS | Status: AC
  Filled 2016-02-21: qty 1

## 2016-02-21 MED ORDER — LIDOCAINE HCL (PF) 1 % IJ SOLN
INTRAMUSCULAR | Status: AC
  Filled 2016-02-21: qty 10

## 2016-02-21 MED ORDER — FENTANYL CITRATE (PF) 100 MCG/2ML IJ SOLN
INTRAMUSCULAR | Status: AC
  Filled 2016-02-21: qty 2

## 2016-02-21 MED ORDER — MIDAZOLAM HCL 2 MG/2ML IJ SOLN
INTRAMUSCULAR | Status: AC | PRN
  Administered 2016-02-21: 1 mg via INTRAVENOUS

## 2016-02-21 MED ORDER — FENTANYL CITRATE (PF) 100 MCG/2ML IJ SOLN
INTRAMUSCULAR | Status: AC | PRN
  Administered 2016-02-21: 50 ug via INTRAVENOUS

## 2016-02-21 NOTE — Procedures (Signed)
S/p LT RENAL CORE BX  No comp Stable Path pending Full report in PACS

## 2016-02-21 NOTE — Sedation Documentation (Signed)
Patient aware to discard breast milk until 5:00 pm and then may resume breast feeding. This is as per Curlene DolphinW Blair PA. Short Stay RN Eunice BlaseDebbie aware of this as well.

## 2016-02-21 NOTE — Sedation Documentation (Signed)
Patient is resting comfortably. 

## 2016-02-21 NOTE — Sedation Documentation (Signed)
Patient denies pain and is resting comfortably.  

## 2016-02-21 NOTE — Discharge Instructions (Signed)
Needle Biopsy, Care After °Introduction °These instructions give you information about caring for yourself after your procedure. Your doctor may also give you more specific instructions. Call your doctor if you have any problems or questions after your procedure. °Follow these instructions at home: °· Rest as told by your doctor. °· Take medicines only as told by your doctor. °· There are many different ways to close and cover the biopsy site, including stitches (sutures), skin glue, and adhesive strips. Follow instructions from your doctor about: °¨ How to take care of your biopsy site. °¨ When and how you should change your bandage (dressing). °¨ When you should remove your dressing. °¨ Removing whatever was used to close your biopsy site. °· Check your biopsy site every day for signs of infection. Watch for: °¨ Redness, swelling, or pain. °¨ Fluid, blood, or pus. °Contact a doctor if: °· You have a fever. °· You have redness, swelling, or pain at the biopsy site, and it lasts longer than a few days. °· You have fluid, blood, or pus coming from the biopsy site. °· You feel sick to your stomach (nauseous). °· You throw up (vomit). °Get help right away if: °· You are short of breath. °· You have trouble breathing. °· Your chest hurts. °· You feel dizzy or you pass out (faint). °· You have bleeding that does not stop with pressure or a bandage. °· You cough up blood. °· Your belly (abdomen) hurts. °This information is not intended to replace advice given to you by your health care provider. Make sure you discuss any questions you have with your health care provider. °Document Released: 01/16/2008 Document Revised: 07/11/2015 Document Reviewed: 01/29/2014 °© 2017 Elsevier ° °

## 2016-02-21 NOTE — H&P (Signed)
Chief Complaint: Proteinuria  Referring Physician(s): Upton,Elizabeth  Supervising Physician: Ruel FavorsShick, Trevor  [facility]  History of Present Illness: Julia KirkLetitia Hall is a 26 y.o. female here today for random renal biopsy.  She recently delivered a healthy baby and during her hospital stay a UA revealed proteinuria.  The value was 195 (normal is 0-15). Her creatinine is normal.  She is otherwise healthy.  She does have several family members who have had renal biopsies before including her grandmother and an aunt.  She is NPO. She does not take blood thinners.  Past Medical History:  Diagnosis Date  . Seasonal allergies     History reviewed. No pertinent surgical history.  Allergies: Penicillins  Medications: Prior to Admission medications   Medication Sig Start Date End Date Taking? Authorizing Provider  cetirizine (ZYRTEC) 10 MG tablet Take 10 mg by mouth daily as needed for allergies.    Yes Historical Provider, MD  Prenatal Vit-Fe Fumarate-FA (MULTIVITAMIN-PRENATAL) 27-0.8 MG TABS tablet Take 1 tablet by mouth daily at 12 noon.   Yes Historical Provider, MD  triamcinolone cream (KENALOG) 0.1 % Apply 1 application topically 2 (two) times daily as needed. For eczema. 08/09/13  Yes Historical Provider, MD     History reviewed. No pertinent family history.  Social History   Social History  . Marital status: Single    Spouse name: N/A  . Number of children: N/A  . Years of education: N/A   Social History Main Topics  . Smoking status: Former Smoker    Types: Cigarettes  . Smokeless tobacco: Never Used  . Alcohol use No  . Drug use: No  . Sexual activity: Not Asked   Other Topics Concern  . None   Social History Narrative  . None    Review of Systems: A 12 point ROS discussed  Review of Systems  Constitutional: Negative.   HENT: Negative.   Respiratory: Negative.   Cardiovascular: Negative.   Gastrointestinal: Negative.     Musculoskeletal: Negative.   Skin: Negative.   Neurological: Negative.   Hematological: Negative.   Psychiatric/Behavioral: Negative.     Vital Signs: BP 127/89   Pulse 73   Temp 98.2 F (36.8 C) (Oral)   Resp 18   Ht 5\' 5"  (1.651 m)   Wt 187 lb (84.8 kg)   LMP 01/21/2016   SpO2 97%   BMI 31.12 kg/m   Physical Exam  Constitutional: She is oriented to person, place, and time. She appears well-developed and well-nourished.  HENT:  Head: Normocephalic and atraumatic.  Eyes: EOM are normal.  Neck: Normal range of motion.  Cardiovascular: Normal rate, regular rhythm and normal heart sounds.   No murmur heard. Pulmonary/Chest: Effort normal and breath sounds normal. No respiratory distress. She has no wheezes.  Abdominal: Soft. She exhibits no distension. There is no tenderness.  Musculoskeletal: Normal range of motion.  Neurological: She is alert and oriented to person, place, and time.  Skin: Skin is warm and dry.  Psychiatric: She has a normal mood and affect. Her behavior is normal. Judgment and thought content normal.  Vitals reviewed.   Mallampati Score:  MD Evaluation Airway: WNL Heart: WNL Abdomen: WNL Chest/ Lungs: WNL ASA  Classification: 2 Mallampati/Airway Score: One  Imaging: No results found.  Labs:  CBC: No results for input(s): WBC, HGB, HCT, PLT in the last 8760 hours.  COAGS: No results for input(s): INR, APTT in the last 8760 hours.  BMP: No results for  input(s): NA, K, CL, CO2, GLUCOSE, BUN, CALCIUM, CREATININE, GFRNONAA, GFRAA in the last 8760 hours.  Invalid input(s): CMP  LIVER FUNCTION TESTS: No results for input(s): BILITOT, AST, ALT, ALKPHOS, PROT, ALBUMIN in the last 8760 hours.  TUMOR MARKERS: No results for input(s): AFPTM, CEA, CA199, CHROMGRNA in the last 8760 hours.  Assessment and Plan:  Proteinuria in an otherwise healthy patient.  Will proceed with image guided random renal biopsy today by Dr. Miles Costain.  Risks and  Benefits discussed with the patient including, but not limited to bleeding, infection, damage to adjacent structures or low yield requiring additional tests.  All of the patient's questions were answered, patient is agreeable to proceed. Consent signed and in chart.  Thank you for this interesting consult.  I greatly enjoyed meeting Julia Hall and look forward to participating in their care.  A copy of this report was sent to the requesting provider on this date.  Electronically Signed: Gwynneth Macleod PA-C 02/21/2016, 8:01 AM   I spent a total of  30 Minutes in face to face in clinical consultation, greater than 50% of which was counseling/coordinating care for renal biopsy

## 2016-02-25 ENCOUNTER — Ambulatory Visit: Payer: BLUE CROSS/BLUE SHIELD | Admitting: Neurology

## 2016-02-26 ENCOUNTER — Encounter: Payer: Self-pay | Admitting: Neurology

## 2016-02-26 ENCOUNTER — Other Ambulatory Visit: Payer: Self-pay | Admitting: Nephrology

## 2016-03-10 ENCOUNTER — Encounter (HOSPITAL_COMMUNITY): Payer: Self-pay

## 2016-03-13 ENCOUNTER — Encounter (HOSPITAL_COMMUNITY): Payer: Self-pay

## 2016-03-13 ENCOUNTER — Encounter (HOSPITAL_COMMUNITY)
Admission: RE | Admit: 2016-03-13 | Discharge: 2016-03-13 | Disposition: A | Discharge status: Home / Self Care | Payer: BLUE CROSS/BLUE SHIELD | Source: Ambulatory Visit | Attending: Nephrology | Admitting: Nephrology

## 2016-03-13 DIAGNOSIS — R809 Proteinuria, unspecified: Secondary | ICD-10-CM | POA: Insufficient documentation

## 2016-03-13 MED ORDER — LEUPROLIDE ACETATE 3.75 MG IM KIT
3.7500 mg | PACK | Freq: Once | INTRAMUSCULAR | Status: AC
  Administered 2016-03-13: 3.75 mg via INTRAMUSCULAR
  Filled 2016-03-13: qty 3.75

## 2016-03-13 NOTE — Progress Notes (Signed)
Pt advised to stop breastfeeding at the pharmacist, Aurther Lofterry request.  It is advised to not breastfeed while on Lupron.  Pt states ok and states she knows she can't breastfeed while on Cytoxan also.  Lupron given in left upper arm IM.  Pt tolerated well.  Pt given d/c instructions on Lupron and given next appointments.  Informed pt of added appointment next Friday 03/20/16 for labwork.  Pt voiced understanding.  Pt advised of short stay policies regarding arriving late and early.  Pt given short stay phone number also.  Informed pt to bring lunch, snacks, things to do for her cytoxan infusion as she will be here for 4-6 hours.  Pt voiced understanding.

## 2016-03-13 NOTE — Discharge Instructions (Signed)
Per pharmacy:  Pt is to stop breastfeeding, because you should not breastfeed while getting the lupron injection.  Pt was advised to call her obgyn for suggestions for stopping breastfeeding.    Leuprolide depot injection What is this medicine? LEUPROLIDE (loo PROE lide) is a man-made protein that acts like a natural hormone in the body. It decreases testosterone in men and decreases estrogen in women. In men, this medicine is used to treat advanced prostate cancer. In women, some forms of this medicine may be used to treat endometriosis, uterine fibroids, or other female hormone-related problems. This medicine may be used for other purposes; ask your health care provider or pharmacist if you have questions. COMMON BRAND NAME(S): Eligard, Lupron Depot, Lupron Depot-Ped, Viadur What should I tell my health care provider before I take this medicine? They need to know if you have any of these conditions: -diabetes -heart disease or previous heart attack -high blood pressure -high cholesterol -mental illness -osteoporosis -pain or difficulty passing urine -seizures -spinal cord metastasis -stroke -suicidal thoughts, plans, or attempt; a previous suicide attempt by you or a family member -tobacco smoker -unusual vaginal bleeding (women) -an unusual or allergic reaction to leuprolide, benzyl alcohol, other medicines, foods, dyes, or preservatives -pregnant or trying to get pregnant -breast-feeding How should I use this medicine? This medicine is for injection into a muscle or for injection under the skin. It is given by a health care professional in a hospital or clinic setting. The specific product will determine how it will be given to you. Make sure you understand which product you receive and how often you will receive it. Talk to your pediatrician regarding the use of this medicine in children. Special care may be needed. Overdosage: If you think you have taken too much of this medicine  contact a poison control center or emergency room at once. NOTE: This medicine is only for you. Do not share this medicine with others. What if I miss a dose? It is important not to miss a dose. Call your doctor or health care professional if you are unable to keep an appointment. Depot injections: Depot injections are given either once-monthly, every 12 weeks, every 16 weeks, or every 24 weeks depending on the product you are prescribed. The product you are prescribed will be based on if you are female or female, and your condition. Make sure you understand your product and dosing. What may interact with this medicine? Do not take this medicine with any of the following medications: -chasteberry This medicine may also interact with the following medications: -herbal or dietary supplements, like black cohosh or DHEA -female hormones, like estrogens or progestins and birth control pills, patches, rings, or injections -female hormones, like testosterone This list may not describe all possible interactions. Give your health care provider a list of all the medicines, herbs, non-prescription drugs, or dietary supplements you use. Also tell them if you smoke, drink alcohol, or use illegal drugs. Some items may interact with your medicine. What should I watch for while using this medicine? Visit your doctor or health care professional for regular checks on your progress. During the first weeks of treatment, your symptoms may get worse, but then will improve as you continue your treatment. You may get hot flashes, increased bone pain, increased difficulty passing urine, or an aggravation of nerve symptoms. Discuss these effects with your doctor or health care professional, some of them may improve with continued use of this medicine. Female patients may experience a  menstrual cycle or spotting during the first months of therapy with this medicine. If this continues, contact your doctor or health care  professional. What side effects may I notice from receiving this medicine? Side effects that you should report to your doctor or health care professional as soon as possible: -allergic reactions like skin rash, itching or hives, swelling of the face, lips, or tongue -breathing problems -chest pain -depression or memory disorders -pain in your legs or groin -pain at site where injected or implanted -severe headache -swelling of the feet and legs -visual changes -vomiting Side effects that usually do not require medical attention (report to your doctor or health care professional if they continue or are bothersome): -breast swelling or tenderness -decrease in sex drive or performance -diarrhea -hot flashes -loss of appetite -muscle, joint, or bone pains -nausea -redness or irritation at site where injected or implanted -skin problems or acne This list may not describe all possible side effects. Call your doctor for medical advice about side effects. You may report side effects to FDA at 1-800-FDA-1088. Where should I keep my medicine? This drug is given in a hospital or clinic and will not be stored at home. NOTE: This sheet is a summary. It may not cover all possible information. If you have questions about this medicine, talk to your doctor, pharmacist, or health care provider.  2017 Elsevier/Gold Standard (2015-07-18 09:45:17)

## 2016-03-20 ENCOUNTER — Encounter (HOSPITAL_COMMUNITY)
Admission: RE | Admit: 2016-03-20 | Discharge: 2016-03-20 | Disposition: A | Discharge status: Home / Self Care | Payer: BLUE CROSS/BLUE SHIELD | Source: Ambulatory Visit | Attending: Nephrology | Admitting: Nephrology

## 2016-03-20 ENCOUNTER — Other Ambulatory Visit (HOSPITAL_COMMUNITY): Payer: Self-pay

## 2016-03-20 ENCOUNTER — Other Ambulatory Visit (HOSPITAL_COMMUNITY): Payer: Self-pay | Admitting: Pharmacy Technician

## 2016-03-20 ENCOUNTER — Encounter (HOSPITAL_COMMUNITY): Payer: Self-pay

## 2016-03-20 DIAGNOSIS — R809 Proteinuria, unspecified: Secondary | ICD-10-CM | POA: Diagnosis present

## 2016-03-20 HISTORY — DX: Glomerular disease in systemic lupus erythematosus: M32.14

## 2016-03-20 HISTORY — DX: Proteinuria, unspecified: R80.9

## 2016-03-20 LAB — CBC WITH DIFFERENTIAL/PLATELET
BASOS PCT: 0 %
Basophils Absolute: 0 10*3/uL (ref 0.0–0.1)
Eosinophils Absolute: 0.1 10*3/uL (ref 0.0–0.7)
Eosinophils Relative: 1 %
HCT: 37.5 % (ref 36.0–46.0)
HEMOGLOBIN: 12.5 g/dL (ref 12.0–15.0)
Lymphocytes Relative: 34 %
Lymphs Abs: 4.1 10*3/uL — ABNORMAL HIGH (ref 0.7–4.0)
MCH: 27.5 pg (ref 26.0–34.0)
MCHC: 33.3 g/dL (ref 30.0–36.0)
MCV: 82.6 fL (ref 78.0–100.0)
MONOS PCT: 8 %
Monocytes Absolute: 0.9 10*3/uL (ref 0.1–1.0)
NEUTROS PCT: 57 %
Neutro Abs: 6.8 10*3/uL (ref 1.7–7.7)
Platelets: 209 10*3/uL (ref 150–400)
RBC: 4.54 MIL/uL (ref 3.87–5.11)
RDW: 14 % (ref 11.5–15.5)
WBC: 11.8 10*3/uL — ABNORMAL HIGH (ref 4.0–10.5)

## 2016-03-20 LAB — URINALYSIS, ROUTINE W REFLEX MICROSCOPIC
Bacteria, UA: NONE SEEN
Bilirubin Urine: NEGATIVE
Glucose, UA: NEGATIVE mg/dL
Hgb urine dipstick: NEGATIVE
Ketones, ur: NEGATIVE mg/dL
Nitrite: NEGATIVE
Protein, ur: 100 mg/dL — AB
SPECIFIC GRAVITY, URINE: 1.024 (ref 1.005–1.030)
pH: 5 (ref 5.0–8.0)

## 2016-03-20 LAB — RENAL FUNCTION PANEL
ALBUMIN: 3.8 g/dL (ref 3.5–5.0)
ANION GAP: 8 (ref 5–15)
BUN: 15 mg/dL (ref 6–20)
CALCIUM: 8.9 mg/dL (ref 8.9–10.3)
CO2: 29 mmol/L (ref 22–32)
Chloride: 102 mmol/L (ref 101–111)
Creatinine, Ser: 0.77 mg/dL (ref 0.44–1.00)
GFR calc Af Amer: >60 mL/min (ref >60)
GFR calc non Af Amer: >60 mL/min (ref >60)
GLUCOSE: 89 mg/dL (ref 65–99)
PHOSPHORUS: 3.8 mg/dL (ref 2.5–4.6)
Potassium: 3.6 mmol/L (ref 3.5–5.1)
SODIUM: 139 mmol/L (ref 135–145)

## 2016-03-20 LAB — PROTEIN / CREATININE RATIO, URINE
CREATININE, URINE: 207.86 mg/dL
Protein Creatinine Ratio: 0.6 mg/mg{Cre} — ABNORMAL HIGH (ref 0.00–0.15)
TOTAL PROTEIN, URINE: 124 mg/dL

## 2016-03-20 NOTE — Progress Notes (Signed)
Patient here for blood draw prior to starting Cytoxan. Will fax results to MD per order.

## 2016-03-27 ENCOUNTER — Other Ambulatory Visit (HOSPITAL_COMMUNITY): Payer: BLUE CROSS/BLUE SHIELD

## 2016-03-27 ENCOUNTER — Encounter (HOSPITAL_COMMUNITY): Payer: Self-pay

## 2016-03-27 ENCOUNTER — Encounter (HOSPITAL_COMMUNITY)
Admission: RE | Admit: 2016-03-27 | Discharge: 2016-03-27 | Disposition: A | Discharge status: Home / Self Care | Payer: BLUE CROSS/BLUE SHIELD | Source: Ambulatory Visit | Attending: Nephrology | Admitting: Nephrology

## 2016-03-27 ENCOUNTER — Encounter (HOSPITAL_COMMUNITY): Payer: BLUE CROSS/BLUE SHIELD

## 2016-03-27 ENCOUNTER — Other Ambulatory Visit (HOSPITAL_COMMUNITY): Payer: Self-pay | Admitting: Nephrology

## 2016-03-27 DIAGNOSIS — R809 Proteinuria, unspecified: Secondary | ICD-10-CM | POA: Diagnosis not present

## 2016-03-27 MED ORDER — CYCLOPHOSPHAMIDE CHEMO INJECTION 1 GM
1000.0000 mg | INTRAMUSCULAR | Status: DC
  Administered 2016-03-27: 1000 mg via INTRAVENOUS
  Filled 2016-03-27: qty 50

## 2016-03-27 MED ORDER — SODIUM CHLORIDE 0.9 % IV BOLUS (SEPSIS)
500.0000 mL | INTRAVENOUS | Status: DC
  Administered 2016-03-27: 500 mL via INTRAVENOUS

## 2016-03-27 MED ORDER — ONDANSETRON HCL 8 MG PO TABS
8.0000 mg | ORAL_TABLET | ORAL | Status: DC
  Administered 2016-03-27: 8 mg via ORAL
  Filled 2016-03-27: qty 1

## 2016-03-27 MED ORDER — DEXAMETHASONE SODIUM PHOSPHATE 4 MG/ML IJ SOLN
4.0000 mg | INTRAMUSCULAR | Status: DC
  Administered 2016-03-27: 4 mg via INTRAVENOUS
  Filled 2016-03-27: qty 1

## 2016-03-27 MED ORDER — LORAZEPAM 0.5 MG PO TABS
0.5000 mg | ORAL_TABLET | ORAL | Status: DC
  Administered 2016-03-27: 0.5 mg via ORAL
  Filled 2016-03-27: qty 1

## 2016-03-27 NOTE — Progress Notes (Addendum)
Pt had first cytoxan infusion today along with NS hydration and premeds.  Pt tolerated well with no adverse reactions or complications.  Pt was given d/c instructions and information on cytoxan along with her next appointments. Pt had her friend with her and she was d/c home ambulatory with friend to lobby.

## 2016-03-27 NOTE — Discharge Instructions (Signed)

## 2016-04-17 ENCOUNTER — Encounter (HOSPITAL_COMMUNITY): Payer: Self-pay

## 2016-04-17 ENCOUNTER — Encounter (HOSPITAL_COMMUNITY)
Admission: RE | Admit: 2016-04-17 | Discharge: 2016-04-17 | Disposition: A | Discharge status: Home / Self Care | Payer: BLUE CROSS/BLUE SHIELD | Source: Ambulatory Visit | Attending: Nephrology | Admitting: Nephrology

## 2016-04-17 DIAGNOSIS — R809 Proteinuria, unspecified: Secondary | ICD-10-CM | POA: Insufficient documentation

## 2016-04-17 LAB — URINALYSIS, ROUTINE W REFLEX MICROSCOPIC
BACTERIA UA: NONE SEEN
Bilirubin Urine: NEGATIVE
Glucose, UA: NEGATIVE mg/dL
Hgb urine dipstick: NEGATIVE
Ketones, ur: NEGATIVE mg/dL
Leukocytes, UA: NEGATIVE
NITRITE: NEGATIVE
Protein, ur: 100 mg/dL — AB
SPECIFIC GRAVITY, URINE: 1.021 (ref 1.005–1.030)
pH: 6 (ref 5.0–8.0)

## 2016-04-17 LAB — CBC WITH DIFFERENTIAL/PLATELET
BASOS ABS: 0 10*3/uL (ref 0.0–0.1)
Basophils Relative: 0 %
EOS PCT: 0 %
Eosinophils Absolute: 0 10*3/uL (ref 0.0–0.7)
HCT: 38.2 % (ref 36.0–46.0)
Hemoglobin: 12.6 g/dL (ref 12.0–15.0)
LYMPHS PCT: 19 %
Lymphs Abs: 2 10*3/uL (ref 0.7–4.0)
MCH: 28.1 pg (ref 26.0–34.0)
MCHC: 33 g/dL (ref 30.0–36.0)
MCV: 85.3 fL (ref 78.0–100.0)
Monocytes Absolute: 0.4 10*3/uL (ref 0.1–1.0)
Monocytes Relative: 4 %
NEUTROS ABS: 8.4 10*3/uL — AB (ref 1.7–7.7)
NEUTROS PCT: 77 %
PLATELETS: 276 10*3/uL (ref 150–400)
RBC: 4.48 MIL/uL (ref 3.87–5.11)
RDW: 15.6 % — ABNORMAL HIGH (ref 11.5–15.5)
WBC: 10.9 10*3/uL — AB (ref 4.0–10.5)

## 2016-04-17 LAB — PROTEIN / CREATININE RATIO, URINE
Creatinine, Urine: 129.05 mg/dL
Protein Creatinine Ratio: 0.43 mg/mg{Cre} — ABNORMAL HIGH (ref 0.00–0.15)
Total Protein, Urine: 56 mg/dL

## 2016-04-17 LAB — RENAL FUNCTION PANEL
Albumin: 3.7 g/dL (ref 3.5–5.0)
Anion gap: 7 (ref 5–15)
BUN: 17 mg/dL (ref 6–20)
CO2: 28 mmol/L (ref 22–32)
CREATININE: 0.7 mg/dL (ref 0.44–1.00)
Calcium: 9 mg/dL (ref 8.9–10.3)
Chloride: 104 mmol/L (ref 101–111)
GFR calc non Af Amer: >60 mL/min (ref >60)
Glucose, Bld: 112 mg/dL — ABNORMAL HIGH (ref 65–99)
Phosphorus: 4 mg/dL (ref 2.5–4.6)
Potassium: 3.8 mmol/L (ref 3.5–5.1)
Sodium: 139 mmol/L (ref 135–145)

## 2016-04-17 MED ORDER — LEUPROLIDE ACETATE 3.75 MG IM KIT
3.7500 mg | PACK | INTRAMUSCULAR | Status: DC
Start: 2016-04-17 — End: 2016-04-18
  Administered 2016-04-17: 3.75 mg via INTRAMUSCULAR
  Filled 2016-04-17: qty 3.75

## 2016-04-17 NOTE — Progress Notes (Signed)
Patient here today for lab work and lupron injection (3 weeks post Cytoxan infusion and one week prior to second Cytoxan infusion). When asking how she did after first infusion she stated at home the top of her right hand became red, itchy, bumpy the day after the infusion. This was NOT where her iv catheter was as it was in the middle of her forearm. I told patient I appreciated her telling us any side effects/concerns she experienced with the new med. Call placed to Dr. Beaulah CorinUpton's office to make her aware. Sp[facility] with Susy [facility]Matt at [facility] and he will make Dr. Signe ColtUpton aware of above. When lab work resulted from today will fax to office.

## 2016-04-20 ENCOUNTER — Encounter (HOSPITAL_COMMUNITY): Payer: BLUE CROSS/BLUE SHIELD

## 2016-04-24 ENCOUNTER — Other Ambulatory Visit (HOSPITAL_COMMUNITY): Payer: BLUE CROSS/BLUE SHIELD

## 2016-04-24 ENCOUNTER — Encounter (HOSPITAL_COMMUNITY): Payer: Self-pay

## 2016-04-24 ENCOUNTER — Encounter (HOSPITAL_COMMUNITY)
Admission: RE | Admit: 2016-04-24 | Discharge: 2016-04-24 | Disposition: A | Discharge status: Home / Self Care | Payer: BLUE CROSS/BLUE SHIELD | Source: Ambulatory Visit | Attending: Nephrology | Admitting: Nephrology

## 2016-04-24 DIAGNOSIS — R809 Proteinuria, unspecified: Secondary | ICD-10-CM | POA: Diagnosis not present

## 2016-04-24 MED ORDER — SODIUM CHLORIDE 0.9 % IV SOLN
1000.0000 mg | INTRAVENOUS | Status: DC
  Administered 2016-04-24: 1000 mg via INTRAVENOUS
  Filled 2016-04-24: qty 50

## 2016-04-24 MED ORDER — SODIUM CHLORIDE 0.9 % IV BOLUS (SEPSIS)
500.0000 mL | INTRAVENOUS | Status: DC
  Administered 2016-04-24: 500 mL via INTRAVENOUS

## 2016-04-24 MED ORDER — DEXAMETHASONE SODIUM PHOSPHATE 4 MG/ML IJ SOLN
4.0000 mg | INTRAMUSCULAR | Status: DC
  Administered 2016-04-24: 4 mg via INTRAVENOUS
  Filled 2016-04-24: qty 1

## 2016-04-24 MED ORDER — ONDANSETRON HCL 8 MG PO TABS
8.0000 mg | ORAL_TABLET | ORAL | Status: DC
Start: 2016-04-24 — End: 2016-04-25
  Administered 2016-04-24: 8 mg via ORAL
  Filled 2016-04-24: qty 1

## 2016-04-24 MED ORDER — LORAZEPAM 0.5 MG PO TABS
0.5000 mg | ORAL_TABLET | ORAL | Status: DC
  Administered 2016-04-24: 0.5 mg via ORAL
  Filled 2016-04-24: qty 1

## 2016-04-28 ENCOUNTER — Other Ambulatory Visit (HOSPITAL_COMMUNITY): Payer: BLUE CROSS/BLUE SHIELD

## 2016-05-15 ENCOUNTER — Encounter (HOSPITAL_COMMUNITY)
Admission: RE | Admit: 2016-05-15 | Discharge: 2016-05-15 | Disposition: A | Discharge status: Home / Self Care | Payer: BLUE CROSS/BLUE SHIELD | Source: Ambulatory Visit | Attending: Nephrology | Admitting: Nephrology

## 2016-05-15 ENCOUNTER — Encounter (HOSPITAL_COMMUNITY): Payer: Self-pay

## 2016-05-15 DIAGNOSIS — R809 Proteinuria, unspecified: Secondary | ICD-10-CM | POA: Diagnosis not present

## 2016-05-15 LAB — CBC WITH DIFFERENTIAL/PLATELET
BASOS ABS: 0 10*3/uL (ref 0.0–0.1)
Basophils Relative: 0 %
Eosinophils Absolute: 0.1 10*3/uL (ref 0.0–0.7)
Eosinophils Relative: 1 %
HEMATOCRIT: 38.9 % (ref 36.0–46.0)
HEMOGLOBIN: 13.1 g/dL (ref 12.0–15.0)
LYMPHS PCT: 31 %
Lymphs Abs: 3.1 10*3/uL (ref 0.7–4.0)
MCH: 28.9 pg (ref 26.0–34.0)
MCHC: 33.7 g/dL (ref 30.0–36.0)
MCV: 85.7 fL (ref 78.0–100.0)
Monocytes Absolute: 0.8 10*3/uL (ref 0.1–1.0)
Monocytes Relative: 8 %
NEUTROS ABS: 6.2 10*3/uL (ref 1.7–7.7)
Neutrophils Relative %: 60 %
Platelets: 258 10*3/uL (ref 150–400)
RBC: 4.54 MIL/uL (ref 3.87–5.11)
RDW: 15.2 % (ref 11.5–15.5)
WBC: 10.1 10*3/uL (ref 4.0–10.5)

## 2016-05-15 LAB — RENAL FUNCTION PANEL
ANION GAP: 8 (ref 5–15)
Albumin: 4.1 g/dL (ref 3.5–5.0)
BUN: 19 mg/dL (ref 6–20)
CALCIUM: 9.4 mg/dL (ref 8.9–10.3)
CHLORIDE: 103 mmol/L (ref 101–111)
CO2: 29 mmol/L (ref 22–32)
CREATININE: 0.61 mg/dL (ref 0.44–1.00)
Glucose, Bld: 98 mg/dL (ref 65–99)
PHOSPHORUS: 4.6 mg/dL (ref 2.5–4.6)
POTASSIUM: 3.5 mmol/L (ref 3.5–5.1)
Sodium: 140 mmol/L (ref 135–145)

## 2016-05-15 LAB — PROTEIN / CREATININE RATIO, URINE
Creatinine, Urine: 226.12 mg/dL
PROTEIN CREATININE RATIO: 0.58 mg/mg{creat} — AB (ref 0.00–0.15)
Total Protein, Urine: 131 mg/dL

## 2016-05-15 LAB — URINALYSIS, ROUTINE W REFLEX MICROSCOPIC
Bacteria, UA: NONE SEEN
Bilirubin Urine: NEGATIVE
GLUCOSE, UA: NEGATIVE mg/dL
Hgb urine dipstick: NEGATIVE
Ketones, ur: NEGATIVE mg/dL
LEUKOCYTES UA: NEGATIVE
Nitrite: NEGATIVE
PH: 5 (ref 5.0–8.0)
Protein, ur: 100 mg/dL — AB
Specific Gravity, Urine: 1.029 (ref 1.005–1.030)

## 2016-05-15 MED ORDER — LEUPROLIDE ACETATE 3.75 MG IM KIT
3.7500 mg | PACK | INTRAMUSCULAR | Status: DC
  Filled 2016-05-15: qty 3.75

## 2016-05-15 NOTE — Progress Notes (Signed)
Pt will return on Monday 05/18/16 at 1330 for lupron injection.

## 2016-05-16 LAB — C3 COMPLEMENT: C3 COMPLEMENT: 131 mg/dL (ref 82–167)

## 2016-05-16 LAB — C4 COMPLEMENT: Complement C4, Body Fluid: 23 mg/dL (ref 14–44)

## 2016-05-16 LAB — ANTI-DNA ANTIBODY, DOUBLE-STRANDED: ds DNA Ab: 83 IU/mL — ABNORMAL HIGH (ref 0–9)

## 2016-05-18 ENCOUNTER — Encounter (HOSPITAL_COMMUNITY)
Admission: RE | Admit: 2016-05-18 | Discharge: 2016-05-18 | Disposition: A | Discharge status: Home / Self Care | Payer: BLUE CROSS/BLUE SHIELD | Source: Ambulatory Visit | Attending: Nephrology | Admitting: Nephrology

## 2016-05-18 DIAGNOSIS — R809 Proteinuria, unspecified: Secondary | ICD-10-CM | POA: Diagnosis present

## 2016-05-18 LAB — ANTINUCLEAR ANTIBODIES, IFA: ANTINUCLEAR ANTIBODIES, IFA: POSITIVE — AB

## 2016-05-18 LAB — FANA STAINING PATTERNS
Homogeneous Pattern: 1:160 {titer} — ABNORMAL HIGH
Speckled Pattern: 1:320 {titer} — ABNORMAL HIGH

## 2016-05-18 MED ORDER — LEUPROLIDE ACETATE 3.75 MG IM KIT
3.7500 mg | PACK | INTRAMUSCULAR | Status: DC
  Administered 2016-05-18: 3.75 mg via INTRAMUSCULAR
  Filled 2016-05-18: qty 3.75

## 2016-05-18 NOTE — Discharge Instructions (Signed)
Leuprolide depot injection / lupron What is this medicine? LEUPROLIDE (loo PROE lide) is a man-made protein that acts like a natural hormone in the body. It decreases testosterone in men and decreases estrogen in women. In men, this medicine is used to treat advanced prostate cancer. In women, some forms of this medicine may be used to treat endometriosis, uterine fibroids, or other female hormone-related problems. This medicine may be used for other purposes; ask your health care provider or pharmacist if you have questions. COMMON BRAND NAME(S): Eligard, Lupron Depot, Lupron Depot-Ped, Viadur What should I tell my health care provider before I take this medicine? They need to know if you have any of these conditions: -diabetes -heart disease or previous heart attack -high blood pressure -high cholesterol -mental illness -osteoporosis -pain or difficulty passing urine -seizures -spinal cord metastasis -stroke -suicidal thoughts, plans, or attempt; a previous suicide attempt by you or a family member -tobacco smoker -unusual vaginal bleeding (women) -an unusual or allergic reaction to leuprolide, benzyl alcohol, other medicines, foods, dyes, or preservatives -pregnant or trying to get pregnant -breast-feeding How should I use this medicine? This medicine is for injection into a muscle or for injection under the skin. It is given by a health care professional in a hospital or clinic setting. The specific product will determine how it will be given to you. Make sure you understand which product you receive and how often you will receive it. Talk to your pediatrician regarding the use of this medicine in children. Special care may be needed. Overdosage: If you think you have taken too much of this medicine contact a poison control center or emergency room at once. NOTE: This medicine is only for you. Do not share this medicine with others. What if I miss a dose? It is important not to miss  a dose. Call your doctor or health care professional if you are unable to keep an appointment. Depot injections: Depot injections are given either once-monthly, every 12 weeks, every 16 weeks, or every 24 weeks depending on the product you are prescribed. The product you are prescribed will be based on if you are female or female, and your condition. Make sure you understand your product and dosing. What may interact with this medicine? Do not take this medicine with any of the following medications: -chasteberry This medicine may also interact with the following medications: -herbal or dietary supplements, like black cohosh or DHEA -female hormones, like estrogens or progestins and birth control pills, patches, rings, or injections -female hormones, like testosterone This list may not describe all possible interactions. Give your health care provider a list of all the medicines, herbs, non-prescription drugs, or dietary supplements you use. Also tell them if you smoke, drink alcohol, or use illegal drugs. Some items may interact with your medicine. What should I watch for while using this medicine? Visit your doctor or health care professional for regular checks on your progress. During the first weeks of treatment, your symptoms may get worse, but then will improve as you continue your treatment. You may get hot flashes, increased bone pain, increased difficulty passing urine, or an aggravation of nerve symptoms. Discuss these effects with your doctor or health care professional, some of them may improve with continued use of this medicine. Female patients may experience a menstrual cycle or spotting during the first months of therapy with this medicine. If this continues, contact your doctor or health care professional. What side effects may I notice from receiving this  medicine? Side effects that you should report to your doctor or health care professional as soon as possible: -allergic reactions like  skin rash, itching or hives, swelling of the face, lips, or tongue -breathing problems -chest pain -depression or memory disorders -pain in your legs or groin -pain at site where injected or implanted -seizures -severe headache -swelling of the feet and legs -suicidal thoughts or other mood changes -visual changes -vomiting Side effects that usually do not require medical attention (report to your doctor or health care professional if they continue or are bothersome): -breast swelling or tenderness -decrease in sex drive or performance -diarrhea -hot flashes -loss of appetite -muscle, joint, or bone pains -nausea -redness or irritation at site where injected or implanted -skin problems or acne This list may not describe all possible side effects. Call your doctor for medical advice about side effects. You may report side effects to FDA at 1-800-FDA-1088. Where should I keep my medicine? This drug is given in a hospital or clinic and will not be stored at home. NOTE: This sheet is a summary. It may not cover all possible information. If you have questions about this medicine, talk to your doctor, pharmacist, or health care provider.  2018 [employer] Standard (2015-07-18 09:45:53)

## 2016-05-18 NOTE — Progress Notes (Signed)
Pt had labs Fri 3/30.  Scheduled lupron injection rescheduled for today 05/18/16 due to malfunction of syringe.  Medication available later Fri afternoon but Pt unable to wait on Fri. Dr  Beaulah Corin office, [facility] notified.  Request further instructions from office if we need to change Cyclophosphamide infusion for Friday 05/22/16.

## 2016-05-19 ENCOUNTER — Encounter (HOSPITAL_COMMUNITY): Payer: BLUE CROSS/BLUE SHIELD

## 2016-05-19 MED FILL — Leuprolide Acetate For Inj Kit 3.75 MG: INTRAMUSCULAR | Qty: 3.75 | Status: AC

## 2016-05-22 ENCOUNTER — Encounter (HOSPITAL_COMMUNITY): Payer: Self-pay

## 2016-05-22 ENCOUNTER — Encounter (HOSPITAL_COMMUNITY)
Admission: RE | Admit: 2016-05-22 | Discharge: 2016-05-22 | Disposition: A | Discharge status: Home / Self Care | Payer: BLUE CROSS/BLUE SHIELD | Source: Ambulatory Visit | Attending: Nephrology | Admitting: Nephrology

## 2016-05-22 DIAGNOSIS — R809 Proteinuria, unspecified: Secondary | ICD-10-CM | POA: Diagnosis not present

## 2016-05-22 MED ORDER — LORAZEPAM 0.5 MG PO TABS
0.5000 mg | ORAL_TABLET | ORAL | Status: DC
  Administered 2016-05-22: 0.5 mg via ORAL

## 2016-05-22 MED ORDER — SODIUM CHLORIDE 0.9 % IV BOLUS (SEPSIS)
500.0000 mL | INTRAVENOUS | Status: DC
  Administered 2016-05-22: 500 mL via INTRAVENOUS

## 2016-05-22 MED ORDER — DEXAMETHASONE SODIUM PHOSPHATE 4 MG/ML IJ SOLN
4.0000 mg | INTRAMUSCULAR | Status: DC
  Administered 2016-05-22: 4 mg via INTRAVENOUS
  Filled 2016-05-22: qty 1

## 2016-05-22 MED ORDER — ONDANSETRON HCL 8 MG PO TABS
8.0000 mg | ORAL_TABLET | ORAL | Status: DC
  Administered 2016-05-22: 8 mg via ORAL
  Filled 2016-05-22: qty 1

## 2016-05-22 MED ORDER — SODIUM CHLORIDE 0.9 % IV SOLN
1000.0000 mg | INTRAVENOUS | Status: DC
  Administered 2016-05-22: 1000 mg via INTRAVENOUS
  Filled 2016-05-22: qty 50

## 2016-05-26 ENCOUNTER — Other Ambulatory Visit (HOSPITAL_COMMUNITY): Payer: BLUE CROSS/BLUE SHIELD

## 2016-06-12 ENCOUNTER — Encounter (HOSPITAL_COMMUNITY)
Admission: RE | Admit: 2016-06-12 | Discharge: 2016-06-12 | Disposition: A | Discharge status: Home / Self Care | Payer: BLUE CROSS/BLUE SHIELD | Source: Ambulatory Visit | Attending: Nephrology | Admitting: Nephrology

## 2016-06-12 ENCOUNTER — Encounter (HOSPITAL_COMMUNITY): Payer: Self-pay

## 2016-06-12 DIAGNOSIS — R809 Proteinuria, unspecified: Secondary | ICD-10-CM | POA: Diagnosis not present

## 2016-06-12 LAB — CBC WITH DIFFERENTIAL/PLATELET
BASOS ABS: 0 10*3/uL (ref 0.0–0.1)
BASOS PCT: 0 %
Eosinophils Absolute: 0.1 10*3/uL (ref 0.0–0.7)
Eosinophils Relative: 1 %
HCT: 39.4 % (ref 36.0–46.0)
Hemoglobin: 13.2 g/dL (ref 12.0–15.0)
LYMPHS PCT: 25 %
Lymphs Abs: 1.8 10*3/uL (ref 0.7–4.0)
MCH: 28.9 pg (ref 26.0–34.0)
MCHC: 33.5 g/dL (ref 30.0–36.0)
MCV: 86.2 fL (ref 78.0–100.0)
Monocytes Absolute: 0.7 10*3/uL (ref 0.1–1.0)
Monocytes Relative: 9 %
NEUTROS ABS: 4.8 10*3/uL (ref 1.7–7.7)
Neutrophils Relative %: 65 %
PLATELETS: 234 10*3/uL (ref 150–400)
RBC: 4.57 MIL/uL (ref 3.87–5.11)
RDW: 14.7 % (ref 11.5–15.5)
WBC: 7.4 10*3/uL (ref 4.0–10.5)

## 2016-06-12 LAB — RENAL FUNCTION PANEL
Albumin: 3.9 g/dL (ref 3.5–5.0)
Anion gap: 7 (ref 5–15)
BUN: 14 mg/dL (ref 6–20)
CO2: 29 mmol/L (ref 22–32)
CREATININE: 0.59 mg/dL (ref 0.44–1.00)
Calcium: 9.1 mg/dL (ref 8.9–10.3)
Chloride: 105 mmol/L (ref 101–111)
GFR calc Af Amer: >60 mL/min (ref >60)
GLUCOSE: 98 mg/dL (ref 65–99)
PHOSPHORUS: 3.6 mg/dL (ref 2.5–4.6)
Potassium: 3.9 mmol/L (ref 3.5–5.1)
Sodium: 141 mmol/L (ref 135–145)

## 2016-06-12 LAB — URINALYSIS, ROUTINE W REFLEX MICROSCOPIC
Bacteria, UA: NONE SEEN
Bilirubin Urine: NEGATIVE
GLUCOSE, UA: NEGATIVE mg/dL
Hgb urine dipstick: NEGATIVE
KETONES UR: NEGATIVE mg/dL
Leukocytes, UA: NEGATIVE
Nitrite: NEGATIVE
PH: 5 (ref 5.0–8.0)
PROTEIN: 100 mg/dL — AB
Specific Gravity, Urine: 1.02 (ref 1.005–1.030)

## 2016-06-12 LAB — PROTEIN / CREATININE RATIO, URINE
Creatinine, Urine: 202.51 mg/dL
PROTEIN CREATININE RATIO: 0.34 mg/mg{creat} — AB (ref 0.00–0.15)
TOTAL PROTEIN, URINE: 68 mg/dL

## 2016-06-12 MED ORDER — LEUPROLIDE ACETATE 3.75 MG IM KIT
3.7500 mg | PACK | Freq: Once | INTRAMUSCULAR | Status: AC
  Administered 2016-06-12: 3.75 mg via INTRAMUSCULAR
  Filled 2016-06-12: qty 3.75

## 2016-06-16 ENCOUNTER — Encounter (HOSPITAL_COMMUNITY): Payer: BLUE CROSS/BLUE SHIELD

## 2016-06-19 ENCOUNTER — Encounter (HOSPITAL_COMMUNITY): Payer: Self-pay

## 2016-06-19 ENCOUNTER — Encounter (HOSPITAL_COMMUNITY)
Admission: RE | Admit: 2016-06-19 | Discharge: 2016-06-19 | Disposition: A | Discharge status: Home / Self Care | Payer: BLUE CROSS/BLUE SHIELD | Source: Ambulatory Visit | Attending: Nephrology | Admitting: Nephrology

## 2016-06-19 DIAGNOSIS — R809 Proteinuria, unspecified: Secondary | ICD-10-CM | POA: Insufficient documentation

## 2016-06-19 MED ORDER — SODIUM CHLORIDE 0.9 % IV SOLN
1000.0000 mg | INTRAVENOUS | Status: DC
  Administered 2016-06-19: 1000 mg via INTRAVENOUS
  Filled 2016-06-19: qty 50

## 2016-06-19 MED ORDER — ONDANSETRON HCL 8 MG PO TABS
8.0000 mg | ORAL_TABLET | Freq: Once | ORAL | Status: AC
  Administered 2016-06-19: 8 mg via ORAL
  Filled 2016-06-19: qty 1

## 2016-06-19 MED ORDER — DEXAMETHASONE SODIUM PHOSPHATE 4 MG/ML IJ SOLN
4.0000 mg | Freq: Once | INTRAMUSCULAR | Status: AC
  Administered 2016-06-19: 4 mg via INTRAVENOUS
  Filled 2016-06-19: qty 1

## 2016-06-19 MED ORDER — SODIUM CHLORIDE 0.9 % IV SOLN
INTRAVENOUS | Status: AC
  Administered 2016-06-19: 500 mL via INTRAVENOUS

## 2016-06-19 MED ORDER — LORAZEPAM 0.5 MG PO TABS
0.5000 mg | ORAL_TABLET | Freq: Once | ORAL | Status: AC
  Administered 2016-06-19: 0.5 mg via ORAL
  Filled 2016-06-19: qty 1

## 2016-06-19 MED ORDER — SODIUM CHLORIDE 0.9 % IV SOLN
INTRAVENOUS | Status: AC
  Administered 2016-06-19: 08:00:00 via INTRAVENOUS

## 2016-06-24 ENCOUNTER — Other Ambulatory Visit (HOSPITAL_COMMUNITY): Payer: BLUE CROSS/BLUE SHIELD

## 2016-07-10 ENCOUNTER — Encounter (HOSPITAL_COMMUNITY)
Admission: RE | Admit: 2016-07-10 | Discharge: 2016-07-10 | Disposition: A | Discharge status: Home / Self Care | Payer: BLUE CROSS/BLUE SHIELD | Source: Ambulatory Visit | Attending: Nephrology | Admitting: Nephrology

## 2016-07-10 DIAGNOSIS — R809 Proteinuria, unspecified: Secondary | ICD-10-CM | POA: Diagnosis not present

## 2016-07-10 LAB — CBC WITH DIFFERENTIAL/PLATELET
BASOS PCT: 0 %
Basophils Absolute: 0 10*3/uL (ref 0.0–0.1)
EOS ABS: 0.1 10*3/uL (ref 0.0–0.7)
Eosinophils Relative: 1 %
HCT: 40.7 % (ref 36.0–46.0)
HEMOGLOBIN: 13.5 g/dL (ref 12.0–15.0)
Lymphocytes Relative: 26 %
Lymphs Abs: 1.5 10*3/uL (ref 0.7–4.0)
MCH: 28.2 pg (ref 26.0–34.0)
MCHC: 33.2 g/dL (ref 30.0–36.0)
MCV: 85.1 fL (ref 78.0–100.0)
MONOS PCT: 6 %
Monocytes Absolute: 0.3 10*3/uL (ref 0.1–1.0)
NEUTROS PCT: 67 %
Neutro Abs: 3.8 10*3/uL (ref 1.7–7.7)
PLATELETS: 240 10*3/uL (ref 150–400)
RBC: 4.78 MIL/uL (ref 3.87–5.11)
RDW: 13.6 % (ref 11.5–15.5)
WBC: 5.7 10*3/uL (ref 4.0–10.5)

## 2016-07-10 LAB — RENAL FUNCTION PANEL
ALBUMIN: 4 g/dL (ref 3.5–5.0)
ANION GAP: 7 (ref 5–15)
BUN: 10 mg/dL (ref 6–20)
CALCIUM: 9.3 mg/dL (ref 8.9–10.3)
CHLORIDE: 105 mmol/L (ref 101–111)
CO2: 27 mmol/L (ref 22–32)
CREATININE: 0.57 mg/dL (ref 0.44–1.00)
GFR calc Af Amer: >60 mL/min (ref >60)
Glucose, Bld: 105 mg/dL — ABNORMAL HIGH (ref 65–99)
Phosphorus: 3.3 mg/dL (ref 2.5–4.6)
Potassium: 3.9 mmol/L (ref 3.5–5.1)
Sodium: 139 mmol/L (ref 135–145)

## 2016-07-10 LAB — URINALYSIS, ROUTINE W REFLEX MICROSCOPIC
BILIRUBIN URINE: NEGATIVE
Bacteria, UA: NONE SEEN
GLUCOSE, UA: NEGATIVE mg/dL
HGB URINE DIPSTICK: NEGATIVE
Ketones, ur: NEGATIVE mg/dL
LEUKOCYTES UA: NEGATIVE
NITRITE: NEGATIVE
Protein, ur: 30 mg/dL — AB
SPECIFIC GRAVITY, URINE: 1.008 (ref 1.005–1.030)
pH: 5 (ref 5.0–8.0)

## 2016-07-10 LAB — PROTEIN / CREATININE RATIO, URINE
CREATININE, URINE: 53.4 mg/dL
Protein Creatinine Ratio: 0.41 mg/mg{Cre} — ABNORMAL HIGH (ref 0.00–0.15)
TOTAL PROTEIN, URINE: 22 mg/dL

## 2016-07-10 MED ORDER — LEUPROLIDE ACETATE 3.75 MG IM KIT
3.7500 mg | PACK | INTRAMUSCULAR | Status: DC
  Administered 2016-07-10: 3.75 mg via INTRAMUSCULAR
  Filled 2016-07-10 (×2): qty 3.75

## 2016-07-10 NOTE — Progress Notes (Signed)
Inquired about med at [id number] and pharmacy states someone is bringing it to floor.  Med arrived at 1040.  Injection was delayed today due to waiting on pharmacy to deliver the med to floor.  Apologized to pt for the wait.  Pt was understanding, and states that's ok.  Pt was d/c ambulatory to lobby with significant other and infant.  Pt given future appointments also.

## 2016-07-15 ENCOUNTER — Encounter (HOSPITAL_COMMUNITY): Payer: BLUE CROSS/BLUE SHIELD

## 2016-07-17 ENCOUNTER — Encounter (HOSPITAL_COMMUNITY)
Admission: RE | Admit: 2016-07-17 | Discharge: 2016-07-17 | Disposition: A | Discharge status: Home / Self Care | Payer: BLUE CROSS/BLUE SHIELD | Source: Ambulatory Visit | Attending: Nephrology | Admitting: Nephrology

## 2016-07-17 ENCOUNTER — Encounter (HOSPITAL_COMMUNITY): Payer: Self-pay

## 2016-07-17 DIAGNOSIS — R809 Proteinuria, unspecified: Secondary | ICD-10-CM | POA: Insufficient documentation

## 2016-07-17 MED ORDER — SODIUM CHLORIDE 0.9 % IV SOLN
INTRAVENOUS | Status: AC
  Administered 2016-07-17: 12:00:00 via INTRAVENOUS

## 2016-07-17 MED ORDER — SODIUM CHLORIDE 0.9 % IV SOLN
INTRAVENOUS | Status: AC
  Administered 2016-07-17: 08:00:00 via INTRAVENOUS

## 2016-07-17 MED ORDER — SODIUM CHLORIDE 0.9 % IV SOLN
1000.0000 mg | INTRAVENOUS | Status: DC
  Administered 2016-07-17: 1000 mg via INTRAVENOUS
  Filled 2016-07-17: qty 50

## 2016-07-17 MED ORDER — DEXAMETHASONE SODIUM PHOSPHATE 4 MG/ML IJ SOLN
4.0000 mg | Freq: Once | INTRAMUSCULAR | Status: AC
  Administered 2016-07-17: 4 mg via INTRAVENOUS
  Filled 2016-07-17: qty 1

## 2016-07-17 MED ORDER — LORAZEPAM 0.5 MG PO TABS
0.5000 mg | ORAL_TABLET | Freq: Once | ORAL | Status: AC
  Administered 2016-07-17: 0.5 mg via ORAL
  Filled 2016-07-17: qty 1

## 2016-07-17 MED ORDER — ONDANSETRON HCL 8 MG PO TABS
8.0000 mg | ORAL_TABLET | Freq: Once | ORAL | Status: AC
  Administered 2016-07-17: 8 mg via ORAL
  Filled 2016-07-17: qty 1

## 2016-07-17 NOTE — Discharge Instructions (Signed)
°Cyclophosphamide injection °What is this medicine? °CYCLOPHOSPHAMIDE (sye kloe FOSS fa mide) is a chemotherapy drug. It slows the growth of cancer cells. This medicine is used to treat many types of cancer like lymphoma, myeloma, leukemia, breast cancer, and ovarian cancer, to name a few. °This medicine may be used for other purposes; ask your health care provider or pharmacist if you have questions. °COMMON BRAND NAME(S): Cytoxan, Neosar °What should I tell my health care provider before I take this medicine? °They need to know if you have any of these conditions: °-blood disorders °-history of other chemotherapy °-infection °-kidney disease °-liver disease °-recent or ongoing radiation therapy °-tumors in the bone marrow °-an unusual or allergic reaction to cyclophosphamide, other chemotherapy, other medicines, foods, dyes, or preservatives °-pregnant or trying to get pregnant °-breast-feeding °How should I use this medicine? °This drug is usually given as an injection into a vein or muscle or by infusion into a vein. It is administered in a hospital or clinic by a specially trained health care professional. °Talk to your pediatrician regarding the use of this medicine in children. Special care may be needed. °Overdosage: If you think you have taken too much of this medicine contact a poison control center or emergency room at once. °NOTE: This medicine is only for you. Do not share this medicine with others. °What if I miss a dose? °It is important not to miss your dose. Call your doctor or health care professional if you are unable to keep an appointment. °What may interact with this medicine? °This medicine may interact with the following medications: °-amiodarone °-amphotericin B °-azathioprine °-certain antiviral medicines for HIV or AIDS such as protease inhibitors (e.g., indinavir, ritonavir) and zidovudine °-certain blood pressure medications such as benazepril, captopril, enalapril, fosinopril,  lisinopril, moexipril, monopril, perindopril, quinapril, ramipril, trandolapril °-certain cancer medications such as anthracyclines (e.g., daunorubicin, doxorubicin), busulfan, cytarabine, paclitaxel, pentostatin, tamoxifen, trastuzumab °-certain diuretics such as chlorothiazide, chlorthalidone, hydrochlorothiazide, indapamide, metolazone °-certain medicines that treat or prevent blood clots like warfarin °-certain muscle relaxants such as succinylcholine °-cyclosporine °-etanercept °-indomethacin °-medicines to increase blood counts like filgrastim, pegfilgrastim, sargramostim °-medicines used as general anesthesia °-metronidazole °-natalizumab °This list may not describe all possible interactions. Give your health care provider a list of all the medicines, herbs, non-prescription drugs, or dietary supplements you use. Also tell them if you smoke, drink alcohol, or use illegal drugs. Some items may interact with your medicine. °What should I watch for while using this medicine? °Visit your doctor for checks on your progress. This drug may make you feel generally unwell. This is not uncommon, as chemotherapy can affect healthy cells as well as cancer cells. Report any side effects. Continue your course of treatment even though you feel ill unless your doctor tells you to stop. °Drink water or other fluids as directed. Urinate often, even at night. °In some cases, you may be given additional medicines to help with side effects. Follow all directions for their use. °Call your doctor or health care professional for advice if you get a fever, chills or sore throat, or other symptoms of a cold or flu. Do not treat yourself. This drug decreases your body's ability to fight infections. Try to avoid being around people who are sick. °This medicine may increase your risk to bruise or bleed. Call your doctor or health care professional if you notice any unusual bleeding. °Be careful brushing and flossing your teeth or using a  toothpick because you may get an infection or bleed   more easily. If you have any dental work done, tell your dentist you are receiving this medicine. °You may get drowsy or dizzy. Do not drive, use machinery, or do anything that needs mental alertness until you know how this medicine affects you. °Do not become pregnant while taking this medicine or for 1 year after stopping it. Women should inform their doctor if they wish to become pregnant or think they might be pregnant. Men should not father a child while taking this medicine and for 4 months after stopping it. There is a potential for serious side effects to an unborn child. Talk to your health care professional or pharmacist for more information. Do not breast-feed an infant while taking this medicine. °This medicine may interfere with the ability to have a child. This medicine has caused ovarian failure in some women. This medicine has caused reduced sperm counts in some men. You should talk with your doctor or health care professional if you are concerned about your fertility. °If you are going to have surgery, tell your doctor or health care professional that you have taken this medicine. °What side effects may I notice from receiving this medicine? °Side effects that you should report to your doctor or health care professional as soon as possible: °-allergic reactions like skin rash, itching or hives, swelling of the face, lips, or tongue °-low blood counts - this medicine may decrease the number of white blood cells, red blood cells and platelets. You may be at increased risk for infections and bleeding. °-signs of infection - fever or chills, cough, sore throat, pain or difficulty passing urine °-signs of decreased platelets or bleeding - bruising, pinpoint red spots on the skin, black, tarry stools, blood in the urine °-signs of decreased red blood cells - unusually weak or tired, fainting spells, lightheadedness °-breathing problems °-dark  urine °-dizziness °-palpitations °-swelling of the ankles, feet, hands °-trouble passing urine or change in the amount of urine °-weight gain °-yellowing of the eyes or skin °Side effects that usually do not require medical attention (report to your doctor or health care professional if they continue or are bothersome): °-changes in nail or skin color °-hair loss °-missed menstrual periods °-mouth sores °-nausea, vomiting °This list may not describe all possible side effects. Call your doctor for medical advice about side effects. You may report side effects to FDA at 1-800-FDA-1088. °Where should I keep my medicine? °This drug is given in a hospital or clinic and will not be stored at home. °NOTE: This sheet is a summary. It may not cover all possible information. If you have questions about this medicine, talk to your doctor, pharmacist, or health care provider. °© 2018 [employer] (2011-12-18 16:22:58) ° °

## 2016-07-22 ENCOUNTER — Other Ambulatory Visit (HOSPITAL_COMMUNITY): Payer: BLUE CROSS/BLUE SHIELD

## 2016-08-07 ENCOUNTER — Encounter (HOSPITAL_COMMUNITY)
Admission: RE | Admit: 2016-08-07 | Discharge: 2016-08-07 | Disposition: A | Discharge status: Home / Self Care | Payer: BLUE CROSS/BLUE SHIELD | Source: Ambulatory Visit | Attending: Nephrology | Admitting: Nephrology

## 2016-08-07 ENCOUNTER — Encounter (HOSPITAL_COMMUNITY): Payer: Self-pay

## 2016-08-07 DIAGNOSIS — R809 Proteinuria, unspecified: Secondary | ICD-10-CM | POA: Diagnosis not present

## 2016-08-07 LAB — URINALYSIS, COMPLETE (UACMP) WITH MICROSCOPIC
BACTERIA UA: NONE SEEN
Bilirubin Urine: NEGATIVE
Glucose, UA: NEGATIVE mg/dL
Hgb urine dipstick: NEGATIVE
Ketones, ur: NEGATIVE mg/dL
Leukocytes, UA: NEGATIVE
NITRITE: NEGATIVE
Protein, ur: NEGATIVE mg/dL
SPECIFIC GRAVITY, URINE: 1.005 (ref 1.005–1.030)
pH: 5 (ref 5.0–8.0)

## 2016-08-07 LAB — RENAL FUNCTION PANEL
Albumin: 4 g/dL (ref 3.5–5.0)
Anion gap: 7 (ref 5–15)
BUN: 10 mg/dL (ref 6–20)
CALCIUM: 9 mg/dL (ref 8.9–10.3)
CO2: 27 mmol/L (ref 22–32)
CREATININE: 0.59 mg/dL (ref 0.44–1.00)
Chloride: 107 mmol/L (ref 101–111)
GFR calc non Af Amer: >60 mL/min (ref >60)
Glucose, Bld: 112 mg/dL — ABNORMAL HIGH (ref 65–99)
Phosphorus: 2.9 mg/dL (ref 2.5–4.6)
Potassium: 3.5 mmol/L (ref 3.5–5.1)
SODIUM: 141 mmol/L (ref 135–145)

## 2016-08-07 LAB — CBC
HEMATOCRIT: 39.9 % (ref 36.0–46.0)
HEMOGLOBIN: 13.2 g/dL (ref 12.0–15.0)
MCH: 28.6 pg (ref 26.0–34.0)
MCHC: 33.1 g/dL (ref 30.0–36.0)
MCV: 86.6 fL (ref 78.0–100.0)
PLATELETS: 243 10*3/uL (ref 150–400)
RBC: 4.61 MIL/uL (ref 3.87–5.11)
RDW: 13.3 % (ref 11.5–15.5)
WBC: 6.7 10*3/uL (ref 4.0–10.5)

## 2016-08-07 LAB — PROTEIN / CREATININE RATIO, URINE
Creatinine, Urine: 43.86 mg/dL
PROTEIN CREATININE RATIO: 0.23 mg/mg{creat} — AB (ref 0.00–0.15)
Total Protein, Urine: 10 mg/dL

## 2016-08-07 MED ORDER — LEUPROLIDE ACETATE 3.75 MG IM KIT
3.7500 mg | PACK | INTRAMUSCULAR | Status: AC
Start: 2016-08-07 — End: 2016-08-07
  Administered 2016-08-07: 3.75 mg via INTRAMUSCULAR
  Filled 2016-08-07: qty 3.75

## 2016-08-12 ENCOUNTER — Encounter (HOSPITAL_COMMUNITY): Payer: BLUE CROSS/BLUE SHIELD

## 2016-08-14 ENCOUNTER — Encounter (HOSPITAL_COMMUNITY): Payer: Self-pay

## 2016-08-14 ENCOUNTER — Encounter (HOSPITAL_COMMUNITY)
Admission: RE | Admit: 2016-08-14 | Discharge: 2016-08-14 | Disposition: A | Discharge status: Home / Self Care | Payer: BLUE CROSS/BLUE SHIELD | Source: Ambulatory Visit | Attending: Nephrology | Admitting: Nephrology

## 2016-08-14 DIAGNOSIS — R809 Proteinuria, unspecified: Secondary | ICD-10-CM | POA: Diagnosis not present

## 2016-08-14 MED ORDER — ONDANSETRON HCL 8 MG PO TABS
8.0000 mg | ORAL_TABLET | Freq: Once | ORAL | Status: AC
  Administered 2016-08-14: 8 mg via ORAL
  Filled 2016-08-14: qty 1

## 2016-08-14 MED ORDER — SODIUM CHLORIDE 0.9 % IV SOLN
800.0000 mg | Freq: Once | INTRAVENOUS | Status: AC
  Administered 2016-08-14: 800 mg via INTRAVENOUS
  Filled 2016-08-14: qty 40

## 2016-08-14 MED ORDER — LORAZEPAM 0.5 MG PO TABS
0.5000 mg | ORAL_TABLET | Freq: Once | ORAL | Status: AC
  Administered 2016-08-14: 0.5 mg via ORAL
  Filled 2016-08-14: qty 1

## 2016-08-14 MED ORDER — SODIUM CHLORIDE 0.9 % IV SOLN
INTRAVENOUS | Status: DC
  Administered 2016-08-14: 09:00:00 via INTRAVENOUS

## 2016-08-14 MED ORDER — SODIUM CHLORIDE 0.9 % IV SOLN
INTRAVENOUS | Status: DC
  Administered 2016-08-14: 13:00:00 via INTRAVENOUS

## 2016-08-14 MED ORDER — DEXAMETHASONE SODIUM PHOSPHATE 4 MG/ML IJ SOLN
4.0000 mg | Freq: Once | INTRAMUSCULAR | Status: AC
  Administered 2016-08-14: 4 mg via INTRAVENOUS
  Filled 2016-08-14: qty 1

## 2016-08-14 NOTE — Progress Notes (Signed)
Pt completed last dose of Cytoxan.  Pt received monthly for 6 months.  No reaction or side effects noted during treatment today.  Pt tolerated well.  Pt d/c ambulatory with significant other and her child to lobby.

## 2016-08-20 ENCOUNTER — Other Ambulatory Visit (HOSPITAL_COMMUNITY): Payer: BLUE CROSS/BLUE SHIELD

## 2016-08-21 ENCOUNTER — Other Ambulatory Visit (HOSPITAL_COMMUNITY): Payer: BLUE CROSS/BLUE SHIELD

## 2016-09-11 ENCOUNTER — Encounter (HOSPITAL_COMMUNITY): Payer: BLUE CROSS/BLUE SHIELD

## 2016-12-07 ENCOUNTER — Encounter (HOSPITAL_COMMUNITY): Payer: Self-pay

## 2016-12-07 ENCOUNTER — Emergency Department (HOSPITAL_COMMUNITY)
Admission: EM | Admit: 2016-12-07 | Discharge: 2016-12-08 | Discharge status: Against Medical Advice | Payer: BLUE CROSS/BLUE SHIELD | Attending: Emergency Medicine | Admitting: Emergency Medicine

## 2016-12-07 DIAGNOSIS — J302 Other seasonal allergic rhinitis: Secondary | ICD-10-CM | POA: Diagnosis not present

## 2016-12-07 DIAGNOSIS — Z79899 Other long term (current) drug therapy: Secondary | ICD-10-CM | POA: Diagnosis not present

## 2016-12-07 DIAGNOSIS — R1031 Right lower quadrant pain: Secondary | ICD-10-CM | POA: Insufficient documentation

## 2016-12-07 HISTORY — DX: Glomerular disease in systemic lupus erythematosus: M32.14

## 2016-12-07 LAB — URINALYSIS, ROUTINE W REFLEX MICROSCOPIC
Bacteria, UA: NONE SEEN
Bilirubin Urine: NEGATIVE
GLUCOSE, UA: NEGATIVE mg/dL
Hgb urine dipstick: NEGATIVE
KETONES UR: NEGATIVE mg/dL
Leukocytes, UA: NEGATIVE
Nitrite: NEGATIVE
PH: 5 (ref 5.0–8.0)
Protein, ur: 100 mg/dL — AB
SPECIFIC GRAVITY, URINE: 1.029 (ref 1.005–1.030)

## 2016-12-07 LAB — CBC
HCT: 41.3 % (ref 36.0–46.0)
HEMOGLOBIN: 13.7 g/dL (ref 12.0–15.0)
MCH: 28 pg (ref 26.0–34.0)
MCHC: 33.2 g/dL (ref 30.0–36.0)
MCV: 84.5 fL (ref 78.0–100.0)
PLATELETS: 261 10*3/uL (ref 150–400)
RBC: 4.89 MIL/uL (ref 3.87–5.11)
RDW: 13.3 % (ref 11.5–15.5)
WBC: 8.2 10*3/uL (ref 4.0–10.5)

## 2016-12-07 LAB — LIPASE, BLOOD: Lipase: 28 U/L (ref 11–51)

## 2016-12-07 LAB — COMPREHENSIVE METABOLIC PANEL
ALBUMIN: 4.3 g/dL (ref 3.5–5.0)
ALK PHOS: 70 U/L (ref 38–126)
ALT: 24 U/L (ref 14–54)
ANION GAP: 9 (ref 5–15)
AST: 20 U/L (ref 15–41)
BILIRUBIN TOTAL: 0.5 mg/dL (ref 0.3–1.2)
BUN: 16 mg/dL (ref 6–20)
CALCIUM: 9.4 mg/dL (ref 8.9–10.3)
CO2: 25 mmol/L (ref 22–32)
CREATININE: 0.83 mg/dL (ref 0.44–1.00)
Chloride: 103 mmol/L (ref 101–111)
GFR calc non Af Amer: >60 mL/min (ref >60)
GLUCOSE: 98 mg/dL (ref 65–99)
Potassium: 4 mmol/L (ref 3.5–5.1)
Sodium: 137 mmol/L (ref 135–145)
Total Protein: 7.8 g/dL (ref 6.5–8.1)

## 2016-12-07 LAB — I-STAT BETA HCG BLOOD, ED (MC, WL, AP ONLY): I-stat hCG, quantitative: <5 m[IU]/mL (ref <5)

## 2016-12-07 NOTE — ED Notes (Signed)
Bed: WA04 Expected date:  Expected time:  Means of arrival:  Comments: [place]

## 2016-12-07 NOTE — ED Triage Notes (Signed)
Patient c/o right mid abdominal pain since 1530 today. Patient denies N/v/d.

## 2016-12-08 ENCOUNTER — Ambulatory Visit (HOSPITAL_COMMUNITY): Admission: RE | Admit: 2016-12-08 | Payer: BLUE CROSS/BLUE SHIELD | Source: Ambulatory Visit

## 2016-12-08 ENCOUNTER — Emergency Department (HOSPITAL_COMMUNITY): Payer: BLUE CROSS/BLUE SHIELD

## 2016-12-08 ENCOUNTER — Encounter (HOSPITAL_COMMUNITY): Payer: Self-pay

## 2016-12-08 MED ORDER — IOPAMIDOL (ISOVUE-300) INJECTION 61%
INTRAVENOUS | Status: AC
  Filled 2016-12-08: qty 100

## 2016-12-08 MED ORDER — IOPAMIDOL (ISOVUE-300) INJECTION 61%: 100.0000 mL | Freq: Once | INTRAVENOUS | Status: DC | PRN

## 2016-12-08 NOTE — ED Notes (Signed)
Went to round on patient, no one present in room.

## 2016-12-08 NOTE — ED Notes (Signed)
Pt showed up just now and stated she went to her car to get her charger.  Pt made aware she had been gone for over 30 minutes and she cannot leave again unless she wants to be re-checked in.

## 2016-12-08 NOTE — ED Provider Notes (Signed)
[facility]-EMERGENCY DEPT Provider Note   CSN: [id number] Arrival date & time: 12/07/16  1823     History   Chief Complaint Chief Complaint  Patient presents with  . Abdominal Pain    HPI Julia Hall is a 26 y.o. female with a hx of Lupus presents to the Emergency Department complaining of acute, improved RLQ abd pain onset 3:30pm.  Pt reports at its onset the pain was a 10/10, sharp and stabbing, but is now 2/10, dull and aching.  Pt reports LMP was 2-3 weeks ago and she adamantly denies being sexually active.  No hx of abd surgeries.  No fever, chills, CP, nausea, vomiting, dysuria, hematuria, urinary frequency, urinary urgency, vaginal discharge, vaginal bleeding.  NO known aggravating or alleviating factors.  Pt denies previous pregnancy.  She is taking an immunomodulator for her Lupus.     The history is provided by the patient and medical records. No language interpreter was used.    Past Medical History:  Diagnosis Date  . Lupus nephritis (HCC) 2017  . Lupus nephritis, ISN/RPS class IV (HCC)   . Proteinuria 2017  . Seasonal allergies     There are no active problems to display for this patient.   History reviewed. No pertinent surgical history.  OB History    No data available       Home Medications    Prior to Admission medications   Medication Sig Start Date End Date Taking? Authorizing Provider  cetirizine (ZYRTEC) 10 MG tablet Take 10 mg by mouth daily as needed for allergies.    Yes [provider]  hydroxychloroquine (PLAQUENIL) 200 MG tablet Take 400 mg by mouth at bedtime. 12/04/16  Yes [provider]  losartan (COZAAR) 25 MG tablet Take 25 mg by mouth daily. 12/04/16  Yes [provider]  mycophenolate (CELLCEPT) 500 MG tablet Take 1,000 mg by mouth 2 (two) times daily. 11/17/16  Yes [provider]  triamcinolone cream (KENALOG) 0.1 % Apply 1 application topically 2 (two) times daily as  needed. For eczema. 08/09/13  Yes [provider]  predniSONE (DELTASONE) 20 MG tablet Take 5 mg by mouth every other day.     [provider]  Prenatal Vit-Fe Fumarate-FA (MULTIVITAMIN-PRENATAL) 27-0.8 MG TABS tablet Take 1 tablet by mouth daily at 12 noon.    [provider]  sulfamethoxazole-trimethoprim (BACTRIM DS,SEPTRA DS) 800-160 MG tablet Take 1 tablet by mouth every Monday, Wednesday, and Friday.     [provider]    Family History Family History  Problem Relation Age of Onset  . Hypertension Father     Social History Social History  Substance Use Topics  . Smoking status: Former Smoker    Types: Cigarettes  . Smokeless tobacco: Never Used  . Alcohol use No     Allergies   Penicillins   Review of Systems Review of Systems  Constitutional: Negative for appetite change, diaphoresis, fatigue, fever and unexpected weight change.  HENT: Negative for mouth sores.   Eyes: Negative for visual disturbance.  Respiratory: Negative for cough, chest tightness, shortness of breath and wheezing.   Cardiovascular: Negative for chest pain.  Gastrointestinal: Positive for abdominal pain. Negative for constipation, diarrhea, nausea and vomiting.  Endocrine: Negative for polydipsia, polyphagia and polyuria.  Genitourinary: Positive for pelvic pain. Negative for dysuria, frequency, hematuria and urgency.  Musculoskeletal: Negative for back pain and neck stiffness.  Skin: Negative for rash.  Allergic/Immunologic: Negative for immunocompromised state.  Neurological: Negative  for syncope, light-headedness and headaches.  Hematological: Does not bruise/bleed easily.  Psychiatric/Behavioral: Negative for sleep disturbance. The patient is not nervous/anxious.      Physical Exam Updated Vital Signs BP 120/88 (BP Location: Left Arm)   Pulse 88   Temp 98.8 F (37.1 C) (Oral)   Resp 18   Ht 5\' 6"  (1.676 m)   Wt 90.8 kg (200 lb 3.2 oz)   LMP  11/23/2016   SpO2 99%   BMI 32.31 kg/m   Physical Exam  Constitutional: She appears well-developed and well-nourished. No distress.  Awake, alert, nontoxic appearance  HENT:  Head: Normocephalic and atraumatic.  Mouth/Throat: Oropharynx is clear and moist. No oropharyngeal exudate.  Eyes: Conjunctivae are normal. No scleral icterus.  Neck: Normal range of motion. Neck supple.  Cardiovascular: Normal rate, regular rhythm and intact distal pulses.   Pulmonary/Chest: Effort normal and breath sounds normal. No respiratory distress. She has no wheezes.  Equal chest expansion  Abdominal: Soft. Bowel sounds are normal. She exhibits no mass. There is tenderness in the right lower quadrant. There is no rigidity, no rebound, no guarding and no CVA tenderness.    Musculoskeletal: Normal range of motion. She exhibits no edema.  Neurological: She is alert.  Speech is clear and goal oriented Moves extremities without ataxia  Skin: Skin is warm and dry. She is not diaphoretic.  Psychiatric: She has a normal mood and affect.  Nursing note and vitals reviewed.    ED Treatments / Results  Labs (all labs ordered are listed, but only abnormal results are displayed) Labs Reviewed  URINALYSIS, ROUTINE W REFLEX MICROSCOPIC - Abnormal; Notable for the following:       Result Value   APPearance HAZY (*)    Protein, ur 100 (*)    Squamous Epithelial / LPF 0-5 (*)    All other components within normal limits  LIPASE, BLOOD  COMPREHENSIVE METABOLIC PANEL  CBC  I-STAT BETA HCG BLOOD, ED ([facility], [facility], [facility] ONLY)    Radiology [place] Transvaginal Non-ob  Result Date: 12/08/2016 CLINICAL DATA:  26 y/o  F; right lower lobe abdominal pain. EXAM: TRANSABDOMINAL AND TRANSVAGINAL ULTRASOUND OF PELVIS DOPPLER ULTRASOUND OF OVARIES TECHNIQUE: Both transabdominal and transvaginal ultrasound examinations of the pelvis were performed. Transabdominal technique was performed for global imaging of the pelvis including  uterus, ovaries, adnexal regions, and pelvic cul-de-sac. It was necessary to proceed with endovaginal exam following the transabdominal exam to visualize the adnexa. Color and duplex Doppler ultrasound was utilized to evaluate blood flow to the ovaries. COMPARISON:  02/27/2014 pelvic ultrasound. FINDINGS: Uterus Measurements: 9.0 x 4.3 x 5.5 cm. No fibroids or other mass visualized. Nabothian cyst noted. Endometrium Thickness: 12 mm.  No focal abnormality visualized. Right ovary Measurements: 3.2 x 2.3 x 2.5 cm. Normal appearance/no adnexal mass. Left ovary Measurements: 2.7 x 1.9 x 2.5 cm. Normal appearance/no adnexal mass. Pulsed Doppler evaluation of both ovaries demonstrates normal low-resistance arterial and venous waveforms. Other findings Trace simple pelvic fluid, likely physiologic. IMPRESSION: Normal pelvic ultrasound. Electronically Signed   By: Mitzi HansenLance  Furusawa-Stratton M.D.   On: 12/08/2016 02:14   [place] Pelvis Complete  Result Date: 12/08/2016 CLINICAL DATA:  26 y/o  F; right lower lobe abdominal pain. EXAM: TRANSABDOMINAL AND TRANSVAGINAL ULTRASOUND OF PELVIS DOPPLER ULTRASOUND OF OVARIES TECHNIQUE: Both transabdominal and transvaginal ultrasound examinations of the pelvis were performed. Transabdominal technique was performed for global imaging of the pelvis including uterus, ovaries, adnexal regions, and pelvic cul-de-sac. It was necessary to proceed  with endovaginal exam following the transabdominal exam to visualize the adnexa. Color and duplex Doppler ultrasound was utilized to evaluate blood flow to the ovaries. COMPARISON:  02/27/2014 pelvic ultrasound. FINDINGS: Uterus Measurements: 9.0 x 4.3 x 5.5 cm. No fibroids or other mass visualized. Nabothian cyst noted. Endometrium Thickness: 12 mm.  No focal abnormality visualized. Right ovary Measurements: 3.2 x 2.3 x 2.5 cm. Normal appearance/no adnexal mass. Left ovary Measurements: 2.7 x 1.9 x 2.5 cm. Normal appearance/no adnexal mass. Pulsed  Doppler evaluation of both ovaries demonstrates normal low-resistance arterial and venous waveforms. Other findings Trace simple pelvic fluid, likely physiologic. IMPRESSION: Normal pelvic ultrasound. Electronically Signed   By: Mitzi Hansen M.D.   On: 12/08/2016 02:14   [place] Art/ven Flow Abd Pelv Doppler  Result Date: 12/08/2016 CLINICAL DATA:  26 y/o  F; right lower lobe abdominal pain. EXAM: TRANSABDOMINAL AND TRANSVAGINAL ULTRASOUND OF PELVIS DOPPLER ULTRASOUND OF OVARIES TECHNIQUE: Both transabdominal and transvaginal ultrasound examinations of the pelvis were performed. Transabdominal technique was performed for global imaging of the pelvis including uterus, ovaries, adnexal regions, and pelvic cul-de-sac. It was necessary to proceed with endovaginal exam following the transabdominal exam to visualize the adnexa. Color and duplex Doppler ultrasound was utilized to evaluate blood flow to the ovaries. COMPARISON:  02/27/2014 pelvic ultrasound. FINDINGS: Uterus Measurements: 9.0 x 4.3 x 5.5 cm. No fibroids or other mass visualized. Nabothian cyst noted. Endometrium Thickness: 12 mm.  No focal abnormality visualized. Right ovary Measurements: 3.2 x 2.3 x 2.5 cm. Normal appearance/no adnexal mass. Left ovary Measurements: 2.7 x 1.9 x 2.5 cm. Normal appearance/no adnexal mass. Pulsed Doppler evaluation of both ovaries demonstrates normal low-resistance arterial and venous waveforms. Other findings Trace simple pelvic fluid, likely physiologic. IMPRESSION: Normal pelvic ultrasound. Electronically Signed   By: Mitzi Hansen M.D.   On: 12/08/2016 02:14    Procedures Procedures (including critical care time)  Medications Ordered in ED Medications  iopamidol (ISOVUE-300) 61 % injection (not administered)  iopamidol (ISOVUE-300) 61 % injection 100 mL (not administered)     Initial Impression / Assessment and Plan / ED Course  I have reviewed the triage vital signs and the nursing  notes.  Pertinent labs & imaging results that were available during my care of the patient were reviewed by me and considered in my medical decision making (see chart for details).  Clinical Course as of Dec 09 514  Tue Dec 08, 2016  0258 Discussed patient's lab findings and ultrasound. Normal pelvic ultrasound without evidence of torsion, ovarian cyst.  Discussed low likelihood of appendicitis as patient is afebrile without vomiting, diarrhea or leukocytosis. Patient requests definitive testing for this. Discussed risk and benefit of CT scan with patient including low likelihood for appendicitis; patient wishes to proceed.  [HM]    Clinical Course User Index [HM] Jamaya Sleeth, Dahlia Client, PA-C    Pt with RLQ abd pain.  She Reports acute onset but states that her symptoms have almost resolved. Labs are reassuring. She is without leukocytosis. Pregnancy test negative. No evidence of urinary tract infection. Discussed with patient the importance of pelvic exam for which this would allow me to test for infections as well as help localize the pain. Also discussed posture of ultrasound for further evaluation of her ovaries. After long discussion, patient consents to ultrasound but will not consent to pelvic exam. I did discuss that without additional help localizing her pain it is more difficult to determine if symptoms are secondary to ovarian origin versus possible appendicitis. Patient  states understanding and repeats that she does not want pelvic exam tonight.  5:15 AM After requesting the CT and waiting several hours, patient now reports she does not wish to have the CT scan done today. She states she cannot wait any longer as she needs to go home despite the fact that they are ready for her in CT. Her abdomen remained soft and is nontender. She is without emesis. She remained afebrile. I believe that the low likelihood of her appendicitis remains low. I have discussed early appendicitis precautions with  her and other reasons to return immediately to the emergency department including film and a fever, worsening pain, vomiting or diarrhea. Patient states understanding and is agreement with this plan.  Final Clinical Impressions(s) / ED Diagnoses   Final diagnoses:  Right lower quadrant abdominal pain    New Prescriptions New Prescriptions   No medications on file     Milta Deiters 12/08/16 1610    Gilda Crease, MD 12/08/16 [phone]

## 2016-12-08 NOTE — ED Notes (Signed)
Bed: [id number] Expected date:  Expected time:  Means of arrival:  Comments: Rm 4

## 2016-12-08 NOTE — Discharge Instructions (Signed)
1. Medications: usual home medications °2. Treatment: rest, drink plenty of fluids, advance diet slowly °3. Follow Up: Please followup with your primary doctor in 2 days for discussion of your diagnoses and further evaluation after today's visit; if you do not have a primary care doctor use the resource guide provided to find one; Please return to the ER for persistent vomiting, high fevers or worsening symptoms °

## 2016-12-08 NOTE — ED Notes (Signed)
Pt never returned to room.  Assumed elopement.  Will make MD aware.

## 2017-05-21 ENCOUNTER — Emergency Department (HOSPITAL_COMMUNITY)
Admission: EM | Admit: 2017-05-21 | Discharge: 2017-05-21 | Disposition: A | Discharge status: Home / Self Care | Payer: BLUE CROSS/BLUE SHIELD | Attending: Emergency Medicine | Admitting: Emergency Medicine

## 2017-05-21 ENCOUNTER — Other Ambulatory Visit: Payer: Self-pay

## 2017-05-21 ENCOUNTER — Encounter (HOSPITAL_COMMUNITY): Payer: Self-pay

## 2017-05-21 DIAGNOSIS — Z79899 Other long term (current) drug therapy: Secondary | ICD-10-CM | POA: Diagnosis not present

## 2017-05-21 DIAGNOSIS — R1031 Right lower quadrant pain: Secondary | ICD-10-CM | POA: Insufficient documentation

## 2017-05-21 DIAGNOSIS — Z87891 Personal history of nicotine dependence: Secondary | ICD-10-CM | POA: Insufficient documentation

## 2017-05-21 DIAGNOSIS — R109 Unspecified abdominal pain: Secondary | ICD-10-CM

## 2017-05-21 DIAGNOSIS — M545 Low back pain, unspecified: Secondary | ICD-10-CM

## 2017-05-21 DIAGNOSIS — M3214 Glomerular disease in systemic lupus erythematosus: Secondary | ICD-10-CM | POA: Insufficient documentation

## 2017-05-21 LAB — URINALYSIS, ROUTINE W REFLEX MICROSCOPIC
BILIRUBIN URINE: NEGATIVE
Glucose, UA: NEGATIVE mg/dL
Ketones, ur: NEGATIVE mg/dL
LEUKOCYTES UA: NEGATIVE
NITRITE: NEGATIVE
PROTEIN: NEGATIVE mg/dL
SPECIFIC GRAVITY, URINE: 1.018 (ref 1.005–1.030)
pH: 6 (ref 5.0–8.0)

## 2017-05-21 LAB — CBC
HEMATOCRIT: 38.8 % (ref 36.0–46.0)
HEMOGLOBIN: 13 g/dL (ref 12.0–15.0)
MCH: 28.7 pg (ref 26.0–34.0)
MCHC: 33.5 g/dL (ref 30.0–36.0)
MCV: 85.7 fL (ref 78.0–100.0)
Platelets: 255 10*3/uL (ref 150–400)
RBC: 4.53 MIL/uL (ref 3.87–5.11)
RDW: 13.3 % (ref 11.5–15.5)
WBC: 7.9 10*3/uL (ref 4.0–10.5)

## 2017-05-21 LAB — BASIC METABOLIC PANEL
Anion gap: 6 (ref 5–15)
BUN: 12 mg/dL (ref 6–20)
CALCIUM: 9.5 mg/dL (ref 8.9–10.3)
CHLORIDE: 105 mmol/L (ref 101–111)
CO2: 29 mmol/L (ref 22–32)
CREATININE: 0.56 mg/dL (ref 0.44–1.00)
GFR calc non Af Amer: >60 mL/min (ref >60)
GLUCOSE: 106 mg/dL — AB (ref 65–99)
Potassium: 3.9 mmol/L (ref 3.5–5.1)
Sodium: 140 mmol/L (ref 135–145)

## 2017-05-21 MED ORDER — METHOCARBAMOL 500 MG PO TABS: 500.0000 mg | ORAL_TABLET | Freq: Two times a day (BID) | ORAL | 0 refills | Status: DC

## 2017-05-21 MED ORDER — DIPHENHYDRAMINE HCL 50 MG/ML IJ SOLN: 25.0000 mg | Freq: Once | INTRAMUSCULAR | Status: DC

## 2017-05-21 MED ORDER — ACETAMINOPHEN 500 MG PO TABS
1000.0000 mg | ORAL_TABLET | Freq: Once | ORAL | Status: AC
Start: 2017-05-21 — End: 2017-05-21
  Administered 2017-05-21: 1000 mg via ORAL
  Filled 2017-05-21: qty 2

## 2017-05-21 NOTE — Discharge Instructions (Signed)
Your evaluation today is very reassuring, feel that your pain is likely musculoskeletal, your urinalysis and lab work is very reassuring, does not suggest that this is a problem with your kidneys.  He can continue to take Tylenol and Robaxin for pain.  Follow-up with your primary doctor early next week.  If you have worsening pain, pain with urination blood in your urine, abdominal pain, fevers or chills, nausea vomiting or other new or concerning symptoms please return to the emergency department for reevaluation.

## 2017-05-21 NOTE — ED Provider Notes (Signed)
[facility]-EMERGENCY DEPT Provider Note   CSN: [id number] Arrival date & time: 05/21/17  7829     History   Chief Complaint Chief Complaint  Patient presents with  . Flank Pain    R  . Back Pain    R    HPI Julia Hall is a 27 y.o. female.  Julia Hall is a 27 y.o. Female history of lupus nephritis, and seasonal allergies, who presents to the ED for evaluation of right lower back and flank pain.  Patient reports symptoms started yesterday when she was bending down at work.  Pain is worse with movement, typically forward bending or trying to sit up straight.  Patient has not tried anything to treat the symptoms prior to arrival.  She denies associated fevers or chills, nausea or vomiting, she denies any dysuria, frequency, hematuria.  Patient denies any history of kidney stones, no previous similar pain with her lupus nephritis.  No chest pain or shortness of breath.  Patient denies any numbness, weakness or tingling in her lower extremities.  No history of cancer or IV drug use.     Past Medical History:  Diagnosis Date  . Lupus nephritis (HCC) 2017  . Lupus nephritis, ISN/RPS class IV (HCC)   . Proteinuria 2017  . Seasonal allergies     There are no active problems to display for this patient.   History reviewed. No pertinent surgical history.   OB History   None      Home Medications    Prior to Admission medications   Medication Sig Start Date End Date Taking? Authorizing Provider  cetirizine (ZYRTEC) 10 MG tablet Take 10 mg by mouth daily as needed for allergies.     [provider]  hydroxychloroquine (PLAQUENIL) 200 MG tablet Take 400 mg by mouth at bedtime. 12/04/16   [provider]  losartan (COZAAR) 25 MG tablet Take 25 mg by mouth daily. 12/04/16   [provider]  mycophenolate (CELLCEPT) 500 MG tablet Take 1,000 mg by mouth 2 (two) times daily. 11/17/16   [provider]  predniSONE  (DELTASONE) 20 MG tablet Take 5 mg by mouth every other day.     [provider]  Prenatal Vit-Fe Fumarate-FA (MULTIVITAMIN-PRENATAL) 27-0.8 MG TABS tablet Take 1 tablet by mouth daily at 12 noon.    [provider]  sulfamethoxazole-trimethoprim (BACTRIM DS,SEPTRA DS) 800-160 MG tablet Take 1 tablet by mouth every Monday, Wednesday, and Friday.     [provider]  triamcinolone cream (KENALOG) 0.1 % Apply 1 application topically 2 (two) times daily as needed. For eczema. 08/09/13   [provider]    Family History Family History  Problem Relation Age of Onset  . Hypertension Father     Social History Social History   Tobacco Use  . Smoking status: Former Smoker    Types: Cigarettes  . Smokeless tobacco: Never Used  Substance Use Topics  . Alcohol use: No  . Drug use: No     Allergies   Penicillins   Review of Systems Review of Systems  Constitutional: Negative for chills and fever.  HENT: Negative for congestion, rhinorrhea and sore throat.   Eyes: Negative for visual disturbance.  Respiratory: Negative for cough and shortness of breath.   Cardiovascular: Negative for chest pain.  Gastrointestinal: Negative for abdominal pain, diarrhea, nausea and vomiting.  Genitourinary: Positive for flank pain. Negative for dysuria, frequency and hematuria.  Musculoskeletal: Positive for back pain. Negative for arthralgias,  gait problem, joint swelling, myalgias, neck pain and neck stiffness.  Skin: Negative for color change and rash.  Neurological: Negative for seizures and weakness.     Physical Exam Updated Vital Signs BP 112/70 (BP Location: Left Arm)   Pulse 77   Temp 98.7 F (37.1 C) (Oral)   Resp 16   SpO2 100%   Physical Exam  Constitutional: She appears well-developed and well-nourished. No distress.  Sitting comfortably in no acute distress.  HENT:  Head: Normocephalic and atraumatic.  Eyes: Right eye exhibits no discharge.  Left eye exhibits no discharge.  Cardiovascular: Normal rate, regular rhythm and normal heart sounds.  Pulmonary/Chest: Effort normal and breath sounds normal. No stridor. No respiratory distress. She has no wheezes. She has no rales.  Abdominal: Soft. Bowel sounds are normal. She exhibits no distension and no mass. There is no tenderness. There is no guarding.  Abdomen nontender to palpation in all quadrants without guarding or rebound tenderness, no CVA tenderness bilaterally.  Musculoskeletal:  Tenderness to palpation over the right lower back, no overlying erythema or palpable deformity, no midline spinal tenderness.  Normal range of motion of lower extremities without discomfort.  Neurological: She is alert. Coordination normal.  Bilateral lower extremities with normal strength and sensation intact, 2+ DP pulses bilaterally  Skin: Skin is warm and dry. Capillary refill takes less than 2 seconds. She is not diaphoretic.  Psychiatric: She has a normal mood and affect. Her behavior is normal.  Nursing note and vitals reviewed.    ED Treatments / Results  Labs (all labs ordered are listed, but only abnormal results are displayed) Labs Reviewed  BASIC METABOLIC PANEL - Abnormal; Notable for the following components:      Result Value   Glucose, Bld 106 (*)    All other components within normal limits  URINALYSIS, ROUTINE W REFLEX MICROSCOPIC - Abnormal; Notable for the following components:   Hgb urine dipstick LARGE (*)    Bacteria, UA RARE (*)    Squamous Epithelial / LPF 0-5 (*)    All other components within normal limits  CBC    EKG None  Radiology No results found.  Procedures Procedures (including critical care time)  Medications Ordered in ED Medications  acetaminophen (TYLENOL) tablet 1,000 mg (1,000 mg Oral Given 05/21/17 2121)     Initial Impression / Assessment and Plan / ED Course  I have reviewed the triage vital signs and the nursing notes.  Pertinent  labs & imaging results that were available during my care of the patient were reviewed by me and considered in my medical decision making (see chart for details).  Patient presents to the ED for evaluation of right flank and low back pain that started yesterday.  Patient reports pain is reproducible with movement, particularly with forward bending.  She denies any associated fevers or chills, nausea, vomiting, abdominal pain, urinary symptoms or hematuria.  No history of kidney stones.  Patient has history of lupus nephritis, no history of previous similar pain with her nephritis.  On exam she has normal vitals and is overall well-appearing, no CVA tenderness or abdominal tenderness on exam, pain reproducible with forward bending, no radiation down the lower leg, normal neurologic exam.  I have high suspicion for this being musculoskeletal in nature, will check basic labs and urinalysis to ensure normal kidney function, no proteinuria or signs of kidney stone.  Tylenol for pain.  Labs overall reassuring, there is no leukocytosis and normal hemoglobin, no electrolyte derangements  requiring intervention, normal renal function, urinalysis shows large amount of hemoglobin but no red blood cells, no evidence of infection, no proteinuria.  Workup is reassuring, pain has improved with Tylenol.  At this time patient is stable for discharge home, will continue to treat back pain with Tylenol and Robaxin, patient to follow-up with her primary care doctor.  Strict return precautions discussed.  Patient expressed understanding and is in agreement with plan.  Final Clinical Impressions(s) / ED Diagnoses   Final diagnoses:  Right flank pain  Acute right-sided low back pain without sciatica    ED Discharge Orders        Ordered    methocarbamol (ROBAXIN) 500 MG tablet  2 times daily     05/21/17 2254       Dartha LodgeFord, Livy Ross N, [place] JerseyPA-C 05/21/17 [id number]    Rolan BuccoBelfi, Melanie, MD 05/21/17 2352

## 2017-05-21 NOTE — ED Triage Notes (Signed)
Pt complaining of R lower back/flank pain. She denies any urinary symptoms, but reports that she has a hx of lupus nephritis. She reports that the pain is worse with movement. A&Ox4.

## 2018-01-24 ENCOUNTER — Emergency Department (HOSPITAL_COMMUNITY)
Admission: EM | Admit: 2018-01-24 | Discharge: 2018-01-25 | Disposition: A | Discharge status: Home / Self Care | Payer: BLUE CROSS/BLUE SHIELD | Attending: Emergency Medicine | Admitting: Emergency Medicine

## 2018-01-24 ENCOUNTER — Encounter (HOSPITAL_COMMUNITY): Payer: Self-pay | Admitting: Emergency Medicine

## 2018-01-24 ENCOUNTER — Other Ambulatory Visit: Payer: Self-pay

## 2018-01-24 DIAGNOSIS — Z5321 Procedure and treatment not carried out due to patient leaving prior to being seen by health care provider: Secondary | ICD-10-CM | POA: Insufficient documentation

## 2018-01-24 DIAGNOSIS — R51 Headache: Secondary | ICD-10-CM | POA: Insufficient documentation

## 2018-01-24 DIAGNOSIS — R110 Nausea: Secondary | ICD-10-CM | POA: Diagnosis not present

## 2018-01-24 NOTE — ED Triage Notes (Signed)
Pt reports headache for the last 2 days. Pt reports episodes of nausea accompanying pain. Pt reports pain across forehead into temples.

## 2018-09-28 ENCOUNTER — Ambulatory Visit (INDEPENDENT_AMBULATORY_CARE_PROVIDER_SITE_OTHER): Payer: BLUE CROSS/BLUE SHIELD | Admitting: Neurology

## 2018-09-28 ENCOUNTER — Encounter: Payer: Self-pay | Admitting: Neurology

## 2018-09-28 ENCOUNTER — Other Ambulatory Visit: Payer: Self-pay

## 2018-09-28 VITALS — BP 102/63 | HR 68 | Temp 98.7°F | Ht 66.0 in | Wt 184.5 lb

## 2018-09-28 DIAGNOSIS — G43109 Migraine with aura, not intractable, without status migrainosus: Secondary | ICD-10-CM

## 2018-09-28 MED ORDER — RIZATRIPTAN BENZOATE 5 MG PO TBDP: 5.0000 mg | ORAL_TABLET | ORAL | 12 refills | Status: DC | PRN

## 2018-09-28 MED ORDER — ONDANSETRON 4 MG PO TBDP: 4.0000 mg | ORAL_TABLET | Freq: Three times a day (TID) | ORAL | 6 refills | Status: DC | PRN

## 2018-09-28 NOTE — Progress Notes (Signed)
PATIENT: Julia Hall DOB: 1990/08/27  Chief Complaint  Patient presents with  . Migraine    She is here with her friend today.  Prior to her pregnancy in 2017, she had frequent migraines.  Now, she may go months without having one.  She had two migraines last month.  She does not take medication for her pain but just lays down to rest in dark room.  She has previously been on preventive treatment but does not remember the names of the medications.   . Nephrology    Bufford ButtnerUpton, Elizabeth, MD (referring provider)  . PCP    Daisy FloroWinn, Dana R, MD     HISTORICAL  Julia KirkLetitia Mac is a 28 year old female, seen in request by her nephrologist Albertine Patriciar.Upton, Elizabeth for evaluation of migraine headaches, initial evaluation was on September 28, 2018.  Her primary care physician is Dr. Stann Mainlandana Winn  I have reviewed and summarized the referring note from the referring physician.  She has past medical history of lupus, hematuria, is treated with Plaquenil, also has history of hypertension,  She reported history of migraine headaches since 14, her typical migraine are often preceded by blurry vision, followed by lateralized severe pounding headache with associated light, noise sensitivity, lasting for few hours to few days, she tried over-the-counter Tylenol without helping her symptoms  She was not able to identify a trigger for migraine, it happens sporadically, sometimes has few headaches in a month, but oftentimes can spreading few months without recurrent headaches  Laboratory evaluations in June 2020, creatinine 0.65, normal CMP, CBC hemoglobin of 13.2, ANA was positive, elevated NT double-stranded DNA 43, normal C-reactive protein of 4, ferritin level of 37,   REVIEW OF SYSTEMS: Full 14 system review of systems performed and notable only for as above All other review of systems were negative.  ALLERGIES: Allergies  Allergen Reactions  . Penicillins Rash   HOME MEDICATIONS: Current Outpatient  Medications  Medication Sig Dispense Refill  . cetirizine (ZYRTEC) 10 MG tablet Take 10 mg by mouth daily as needed for allergies.     . hydrocortisone 2.5 % cream Apply 1 application topically daily.    . hydroxychloroquine (PLAQUENIL) 200 MG tablet Take 400 mg by mouth daily.     Marland Kitchen. losartan (COZAAR) 25 MG tablet Take 25 mg by mouth daily.    . mycophenolate (MYFORTIC) 360 MG TBEC EC tablet Take 720 mg by mouth 2 (two) times daily.     Marland Kitchen. triamcinolone cream (KENALOG) 0.1 % Apply 1 application topically 2 (two) times daily as needed. For eczema.     No current facility-administered medications for this visit.     PAST MEDICAL HISTORY: Past Medical History:  Diagnosis Date  . Hematuria   . Hypertension   . Lupus nephritis (HCC) 2017  . Lupus nephritis, ISN/RPS class IV (HCC)   . Migraine   . Proteinuria 2017  . Seasonal allergies     PAST SURGICAL HISTORY: Past Surgical History:  Procedure Laterality Date  . NO PAST SURGERIES      FAMILY HISTORY: Family History  Problem Relation Age of Onset  . Other Mother        unknown medical history  . Hypertension Father     SOCIAL HISTORY: Social History   Socioeconomic History  . Marital status: Single    Spouse name: Not on file  . Number of children: 1  . Years of education: college  . Highest education level: Bachelor's degree (e.g., BA, AB, BS)  Occupational History  . Occupation: Glass blower/designer  Social Needs  . Financial resource strain: Not on file  . Food insecurity    Worry: Not on file    Inability: Not on file  . Transportation needs    Medical: Not on file    Non-medical: Not on file  Tobacco Use  . Smoking status: Former Smoker    Types: Cigarettes  . Smokeless tobacco: Never Used  Substance and Sexual Activity  . Alcohol use: Yes    Comment: occasional use  . Drug use: No  . Sexual activity: Not on file  Lifestyle  . Physical activity    Days per week: Not on file    Minutes per session: Not  on file  . Stress: Not on file  Relationships  . Social Herbalist on phone: Not on file    Gets together: Not on file    Attends religious service: Not on file    Active member of club or organization: Not on file    Attends meetings of clubs or organizations: Not on file    Relationship status: Not on file  . Intimate partner violence    Fear of current or ex partner: Not on file    Emotionally abused: Not on file    Physically abused: Not on file    Forced sexual activity: Not on file  Other Topics Concern  . Not on file  Social History Narrative   Lives with at home with her son.   Right-handed.   Caffeine use:  One soda daily, occasional energy drink     PHYSICAL EXAM   Vitals:   09/28/18 1458  BP: 102/63  Pulse: 68  Temp: 98.7 F (37.1 C)  Weight: 184 lb 8 oz (83.7 kg)  Height: 5\' 6"  (1.676 m)    Not recorded      Body mass index is 29.78 kg/m.  PHYSICAL EXAMNIATION:  Gen: NAD, conversant, well nourised, obese, well groomed                     Cardiovascular: Regular rate rhythm, no peripheral edema, warm, nontender. Eyes: Conjunctivae clear without exudates or hemorrhage Neck: Supple, no carotid bruits. Pulmonary: Clear to auscultation bilaterally   NEUROLOGICAL EXAM:  MENTAL STATUS: Speech:    Speech is normal; fluent and spontaneous with normal comprehension.  Cognition:     Orientation to time, place and person     Normal recent and remote memory     Normal Attention span and concentration     Normal Language, naming, repeating,spontaneous speech     Fund of knowledge   CRANIAL NERVES: CN II: Visual fields are full to confrontation. Fundoscopic exam is normal with sharp discs and no vascular changes. Pupils are round equal and briskly reactive to light. CN III, IV, VI: extraocular movement are normal. No ptosis. CN V: Facial sensation is intact to pinprick in all 3 divisions bilaterally. Corneal responses are intact.  CN VII: Face  is symmetric with normal eye closure and smile. CN VIII: Hearing is normal to rubbing fingers CN IX, X: Palate elevates symmetrically. Phonation is normal. CN XI: Head turning and shoulder shrug are intact CN XII: Tongue is midline with normal movements and no atrophy.  MOTOR: There is no pronator drift of out-stretched arms. Muscle bulk and tone are normal. Muscle strength is normal.  REFLEXES: Reflexes are 2+ and symmetric at the biceps, triceps, knees, and ankles. Plantar responses are  flexor.  SENSORY: Intact to light touch, pinprick, positional sensation and vibratory sensation are intact in fingers and toes.  COORDINATION: Rapid alternating movements and fine finger movements are intact. There is no dysmetria on finger-to-nose and heel-knee-shin.    GAIT/STANCE: Posture is normal. Gait is steady with normal steps, base, arm swing, and turning. Heel and toe walking are normal. Tandem gait is normal.  Romberg is absent.   DIAGNOSTIC DATA (LABS, IMAGING, TESTING) - I reviewed patient records, labs, notes, testing and imaging myself where available.   ASSESSMENT AND PLAN  Julia KirkLetitia Manchester is a 28 y.o. female   Chronic migraine with aura  Maxalt, Zofran as needed  Return to clinic in 3 months with Hessie DienerSarah  Stanislaus Kaltenbach, M.D. Ph.D.  [facility] [address], [address] [place], [place] [address] Ph: [phone] Fax: [phone]  CC: Bufford ButtnerUpton, Elizabeth, MD

## 2018-11-11 IMAGING — US US TRANSVAGINAL NON-OB
1 series · 13 of 25 positions shown · non-contrast
Comparison: 02/27/2014 pelvic ultrasound.

CLINICAL DATA: 25 y/o  F; right lower lobe abdominal pain.

EXAM:
TRANSABDOMINAL AND TRANSVAGINAL ULTRASOUND OF PELVIS
DOPPLER ULTRASOUND OF OVARIES
TECHNIQUE: Both transabdominal and transvaginal ultrasound examinations of the
pelvis were performed. Transabdominal technique was performed for
global imaging of the pelvis including uterus, ovaries, adnexal
regions, and pelvic cul-de-sac.
It was necessary to proceed with endovaginal exam following the
transabdominal exam to visualize the adnexa. Color and duplex
Doppler ultrasound was utilized to evaluate blood flow to the
ovaries.

[Series 1: us transvaginal non-ob · 0.24mm/px · 13 of 66 slices shown]
[im 1/66]
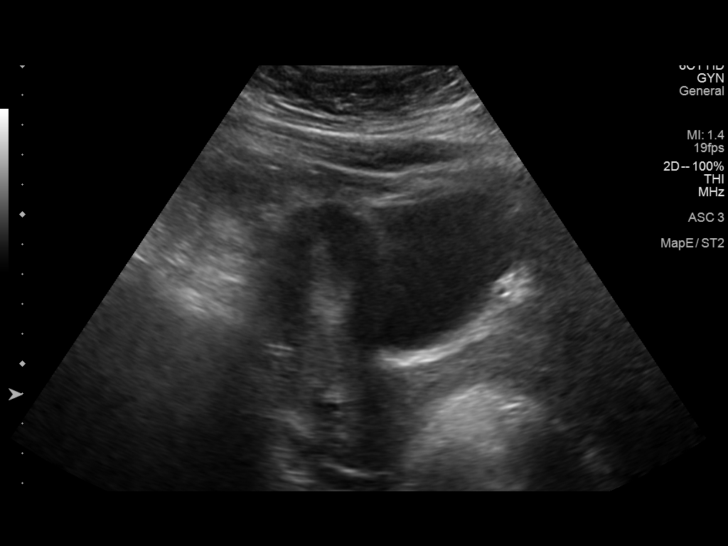
[im 6/66]
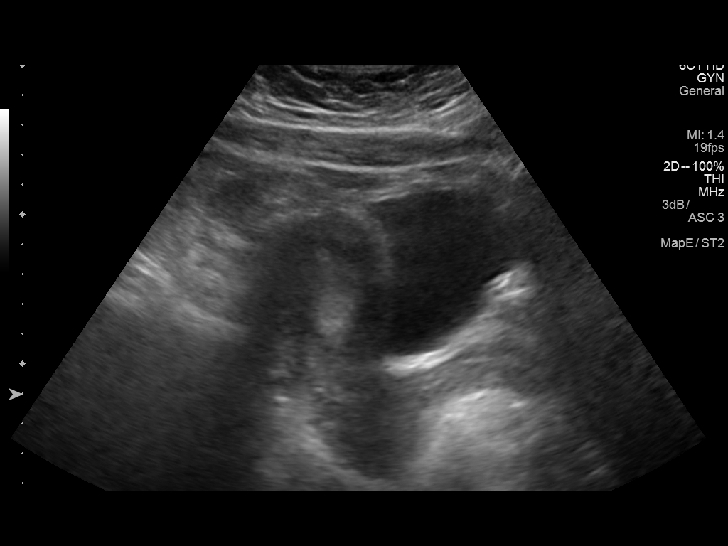
[im 11/66]
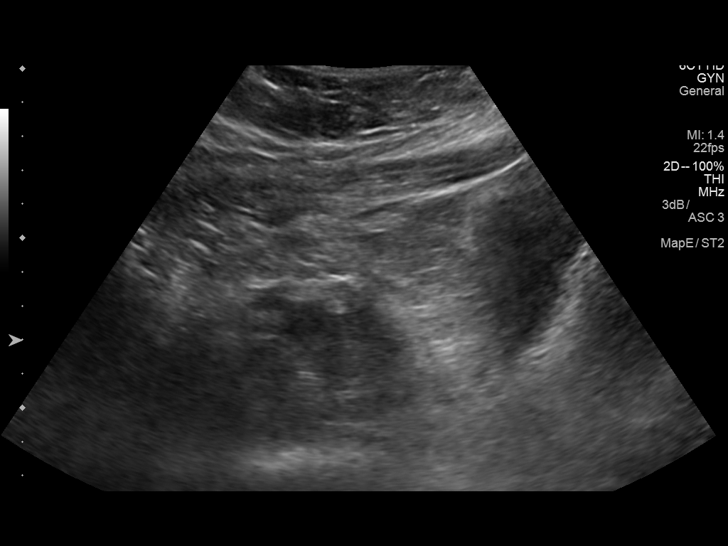
[im 17/66]
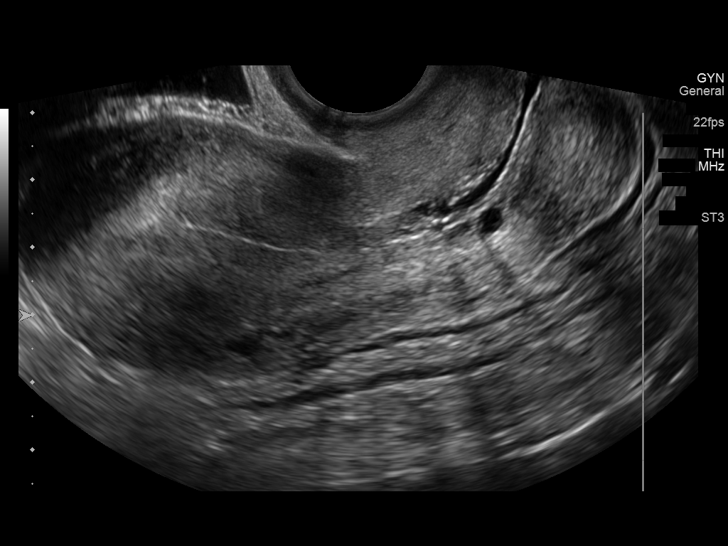
[im 22/66]
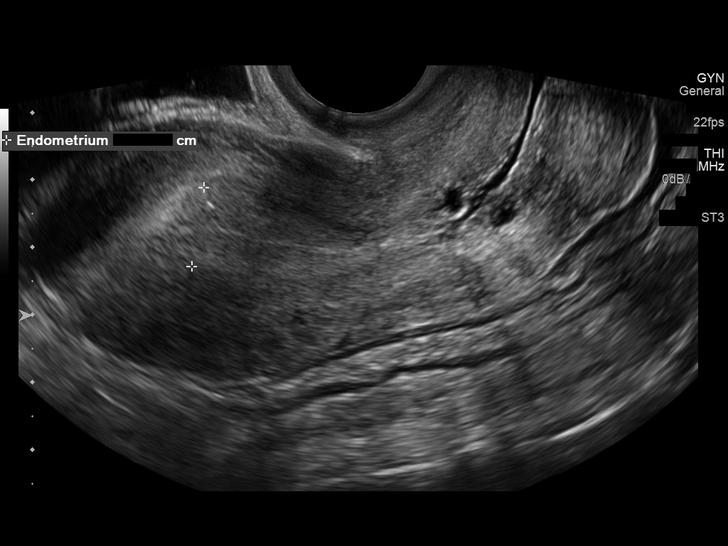
[im 28/66]
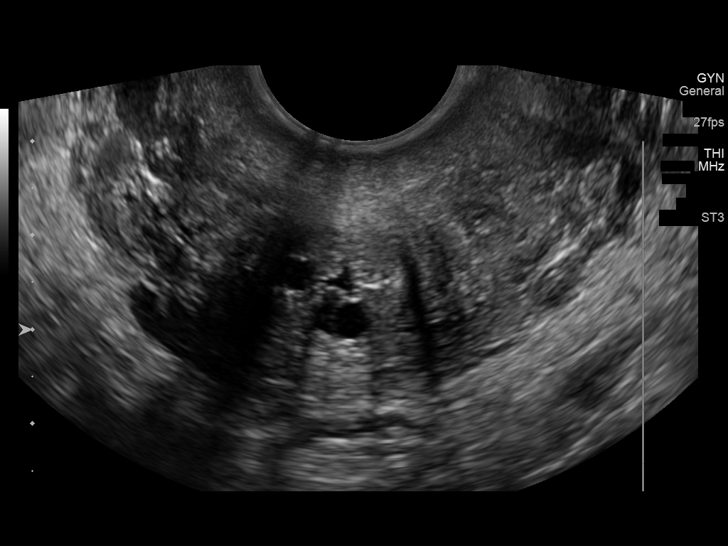
[im 33/66]
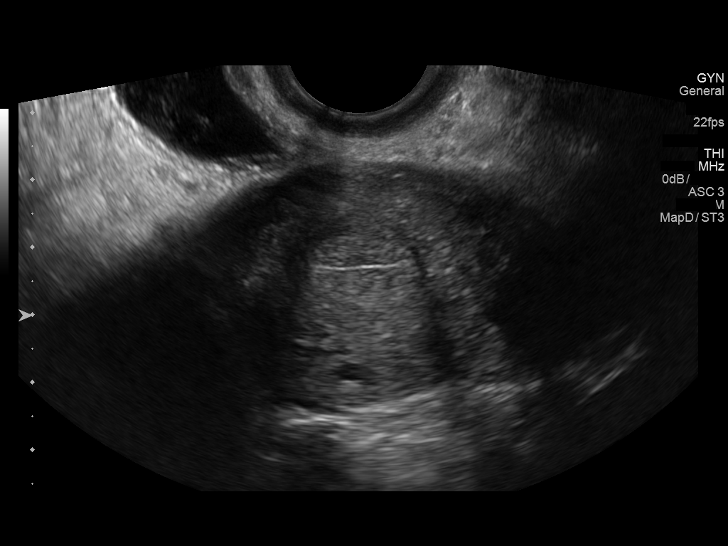
[im 38/66]
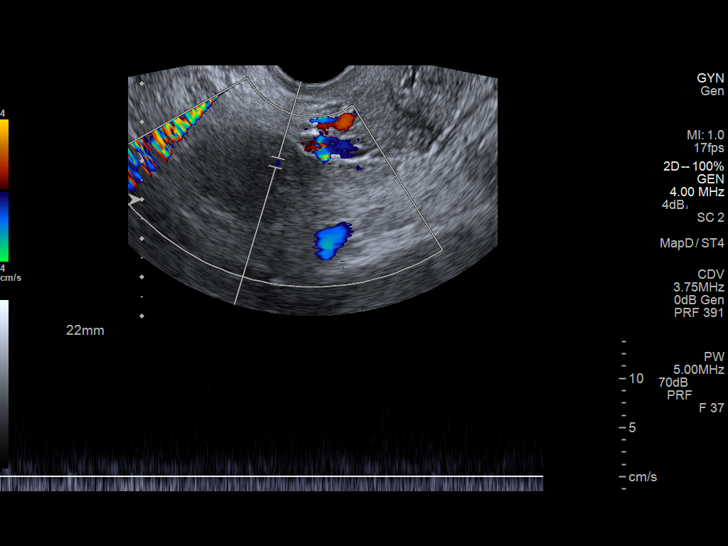
[im 44/66]
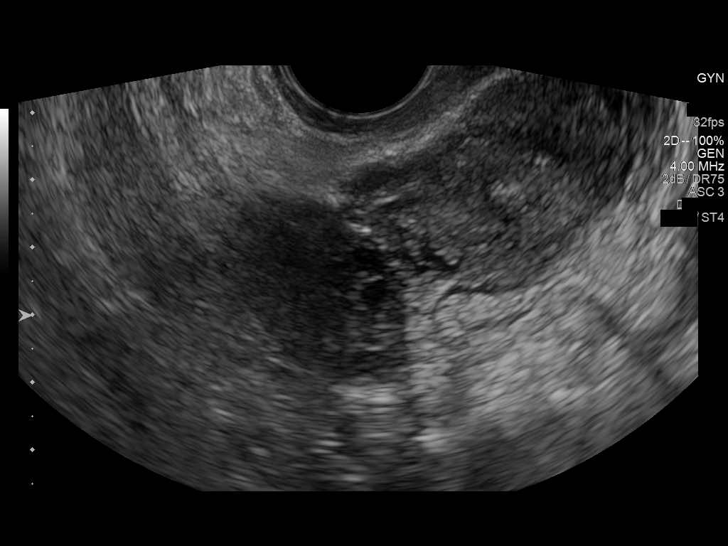
[im 49/66]
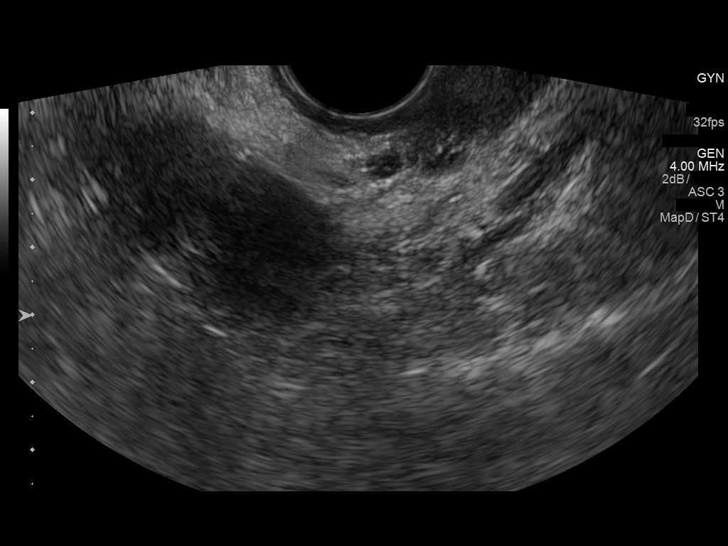
[im 55/66]
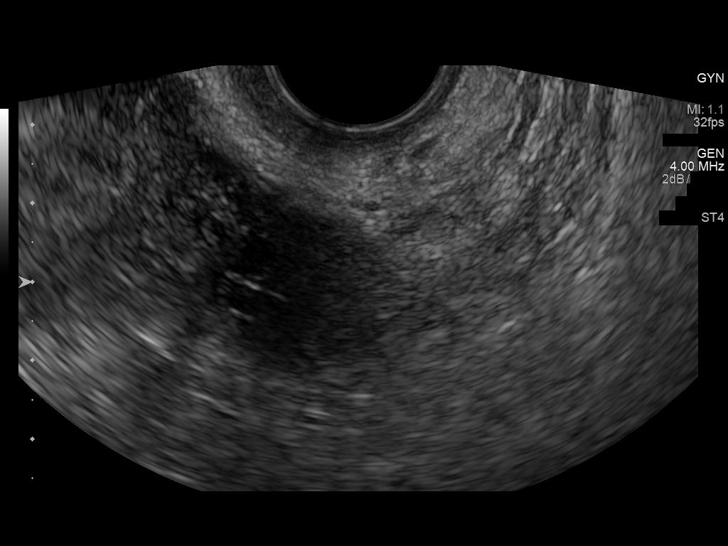
[im 60/66]
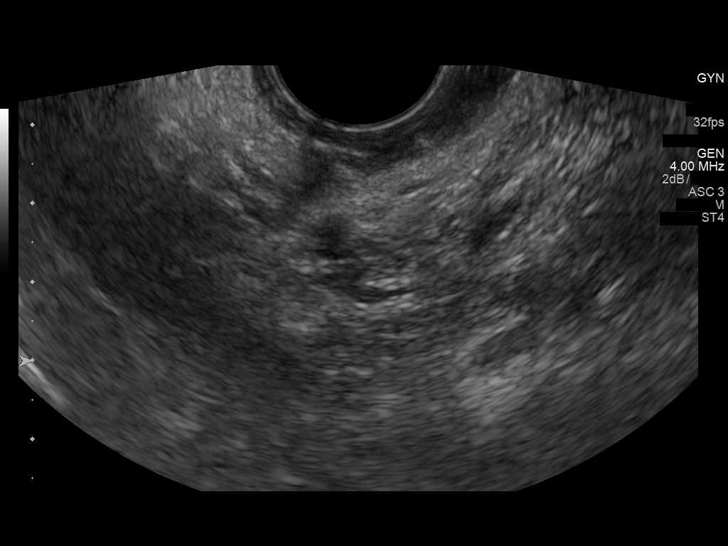
[im 66/66]
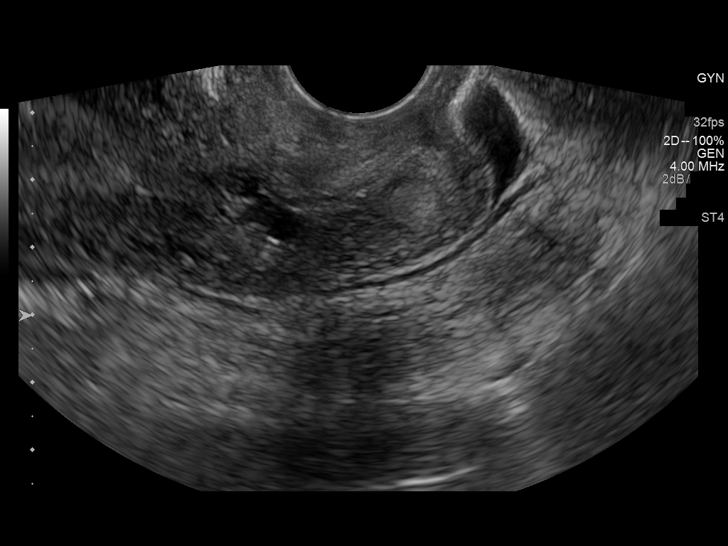

[13 of 25 positions shown; findings below may reference images not displayed]

FINDINGS: Uterus

Measurements: 9.0 x 4.3 x 5.5 cm. No fibroids or other mass
visualized. Nabothian cyst noted.

Endometrium

Thickness: 12 mm.  No focal abnormality visualized.

Right ovary

Measurements: 3.2 x 2.3 x 2.5 cm. Normal appearance/no adnexal mass.

Left ovary

Measurements: 2.7 x 1.9 x 2.5 cm. Normal appearance/no adnexal mass.

Pulsed Doppler evaluation of both ovaries demonstrates normal
low-resistance arterial and venous waveforms.

Other findings

Trace simple pelvic fluid, likely physiologic.
IMPRESSION: Normal pelvic ultrasound.

By: Hege Beate Rennemo M.D.

## 2018-12-28 NOTE — Progress Notes (Deleted)
PATIENT: Kalliope Riesen DOB: [birth year]  REASON FOR VISIT: follow up HISTORY FROM: patient  HISTORY OF PRESENT ILLNESS: Today 12/28/18  HISTORY Sreenidhi Groll is a 28 year old female, seen in request by her nephrologist Albertine Patricia for evaluation of migraine headaches, initial evaluation was on September 28, 2018.  Her primary care physician is Dr. Stann Mainland  I have reviewed and summarized the referring note from the referring physician.  She has past medical history of lupus, hematuria, is treated with Plaquenil, also has history of hypertension,  She reported history of migraine headaches since 14, her typical migraine are often preceded by blurry vision, followed by lateralized severe pounding headache with associated light, noise sensitivity, lasting for few hours to few days, she tried over-the-counter Tylenol without helping her symptoms  She was not able to identify a trigger for migraine, it happens sporadically, sometimes has few headaches in a month, but oftentimes can spreading few months without recurrent headaches  Laboratory evaluations in June 2020, creatinine 0.65, normal CMP, CBC hemoglobin of 13.2, ANA was positive, elevated NT double-stranded DNA 43, normal C-reactive protein of 4, ferritin level of 37,  Update December 29, 2018 SS:  After initial visit, was given Maxalt, Zofran as needed.  REVIEW OF SYSTEMS: Out of a complete 14 system review of symptoms, the patient complains only of the following symptoms, and all other reviewed systems are negative.  ALLERGIES: Allergies  Allergen Reactions  . Penicillins Rash    Has patient had a PCN reaction causing immediate rash, facial/tongue/throat swelling, SOB or lightheadedness with hypotension: {Yes/No:30480221}No Has patient had a PCN reaction causing severe rash involving mucus membranes or skin necrosis: {Yes/No:30480221}No Has patient had a PCN reaction that required hospitalization  {Yes/No:30480221}NO Has patient had a PCN reaction occurring within the last 10 years: {Yes/No:30480221}No If all of the above answers are "NO", then may proceed with Cephalosporin     HOME MEDICATIONS: Outpatient Medications Prior to Visit  Medication Sig Dispense Refill  . cetirizine (ZYRTEC) 10 MG tablet Take 10 mg by mouth daily as needed for allergies.     . hydrocortisone 2.5 % cream Apply 1 application topically daily.    . hydroxychloroquine (PLAQUENIL) 200 MG tablet Take 400 mg by mouth daily.     Marland Kitchen losartan (COZAAR) 25 MG tablet Take 25 mg by mouth daily.    . mycophenolate (MYFORTIC) 360 MG TBEC EC tablet Take 720 mg by mouth 2 (two) times daily.     . ondansetron (ZOFRAN ODT) 4 MG disintegrating tablet Take 1 tablet (4 mg total) by mouth every 8 (eight) hours as needed. 20 tablet 6  . rizatriptan (MAXALT-MLT) 5 MG disintegrating tablet Take 1 tablet (5 mg total) by mouth as needed. May repeat in 2 hours if needed 15 tablet 12  . triamcinolone cream (KENALOG) 0.1 % Apply 1 application topically 2 (two) times daily as needed. For eczema.     No facility-administered medications prior to visit.     PAST MEDICAL HISTORY: Past Medical History:  Diagnosis Date  . Hematuria   . Hypertension   . Lupus nephritis (HCC) 2017  . Lupus nephritis, ISN/RPS class IV (HCC)   . Migraine   . Proteinuria 2017  . Seasonal allergies     PAST SURGICAL HISTORY: Past Surgical History:  Procedure Laterality Date  . NO PAST SURGERIES      FAMILY HISTORY: Family History  Problem Relation Age of Onset  . Other Mother  unknown medical history  . Hypertension Father     SOCIAL HISTORY: Social History   Socioeconomic History  . Marital status: Single    Spouse name: Not on file  . Number of children: 1  . Years of education: college  . Highest education level: Bachelor's degree (e.g., BA, AB, BS)  Occupational History  . Occupation: Glass blower/designer  Social Needs  .  Financial resource strain: Not on file  . Food insecurity    Worry: Not on file    Inability: Not on file  . Transportation needs    Medical: Not on file    Non-medical: Not on file  Tobacco Use  . Smoking status: Former Smoker    Types: Cigarettes  . Smokeless tobacco: Never Used  Substance and Sexual Activity  . Alcohol use: Yes    Comment: occasional use  . Drug use: No  . Sexual activity: Not on file  Lifestyle  . Physical activity    Days per week: Not on file    Minutes per session: Not on file  . Stress: Not on file  Relationships  . Social Herbalist on phone: Not on file    Gets together: Not on file    Attends religious service: Not on file    Active member of club or organization: Not on file    Attends meetings of clubs or organizations: Not on file    Relationship status: Not on file  . Intimate partner violence    Fear of current or ex partner: Not on file    Emotionally abused: Not on file    Physically abused: Not on file    Forced sexual activity: Not on file  Other Topics Concern  . Not on file  Social History Narrative   Lives with at home with her son.   Right-handed.   Caffeine use:  One soda daily, occasional energy drink      PHYSICAL EXAM  There were no vitals filed for this visit. There is no height or weight on file to calculate BMI.  Generalized: Well developed, in no acute distress   Neurological examination  Mentation: Alert oriented to time, place, history taking. Follows all commands speech and language fluent Cranial nerve II-XII: Pupils were equal round reactive to light. Extraocular movements were full, visual field were full on confrontational test. Facial sensation and strength were normal. Uvula tongue midline. Head turning and shoulder shrug  were normal and symmetric. Motor: The motor testing reveals 5 over 5 strength of all 4 extremities. Good symmetric motor tone is noted throughout.  Sensory: Sensory testing is  intact to soft touch on all 4 extremities. No evidence of extinction is noted.  Coordination: Cerebellar testing reveals good finger-nose-finger and heel-to-shin bilaterally.  Gait and station: Gait is normal. Tandem gait is normal. Romberg is negative. No drift is seen.  Reflexes: Deep tendon reflexes are symmetric and normal bilaterally.   DIAGNOSTIC DATA (LABS, IMAGING, TESTING) - I reviewed patient records, labs, notes, testing and imaging myself where available.  Lab Results  Component Value Date   WBC 7.9 05/21/2017   HGB 13.0 05/21/2017   HCT 38.8 05/21/2017   MCV 85.7 05/21/2017   PLT 255 05/21/2017      Component Value Date/Time   NA 140 05/21/2017 2130   K 3.9 05/21/2017 2130   CL 105 05/21/2017 2130   CO2 29 05/21/2017 2130   GLUCOSE 106 (H) 05/21/2017 2130   BUN 12 05/21/2017  2130   CREATININE 0.56 05/21/2017 2130   CALCIUM 9.5 05/21/2017 2130   PROT 7.8 12/07/2016 1956   ALBUMIN 4.3 12/07/2016 1956   AST 20 12/07/2016 1956   ALT 24 12/07/2016 1956   ALKPHOS 70 12/07/2016 1956   BILITOT 0.5 12/07/2016 1956   GFRNONAA >60 05/21/2017 2130   GFRAA >60 05/21/2017 2130   No results found for: CHOL, HDL, LDLCALC, LDLDIRECT, TRIG, CHOLHDL No results found for: WUJW1XHGBA1C No results found for: VITAMINB12 No results found for: TSH    ASSESSMENT AND PLAN 28 y.o. year old female  has a past medical history of Hematuria, Hypertension, Lupus nephritis (HCC) (2017), Lupus nephritis, ISN/RPS class IV (HCC), Migraine, Proteinuria (2017), and Seasonal allergies. here with:  1.  Chronic migraine with aura   I spent 15 minutes with the patient. 50% of this time was spent   Margie EgeSarah Slack, HighlandAGNP-C, DNP 12/28/2018, 7:20 PM [facility] [address], [address] [place], [place] [address] [phone]

## 2018-12-29 ENCOUNTER — Ambulatory Visit: Payer: BLUE CROSS/BLUE SHIELD | Admitting: Neurology

## 2018-12-29 ENCOUNTER — Encounter: Payer: Self-pay | Admitting: Neurology

## 2019-08-04 ENCOUNTER — Other Ambulatory Visit (HOSPITAL_COMMUNITY): Payer: Self-pay

## 2019-08-04 NOTE — Discharge Instructions (Signed)

## 2019-08-07 ENCOUNTER — Encounter (HOSPITAL_COMMUNITY)
Admission: RE | Admit: 2019-08-07 | Discharge: 2019-08-07 | Disposition: A | Discharge status: Home / Self Care | Payer: BC Managed Care – PPO | Source: Ambulatory Visit | Attending: Nephrology | Admitting: Nephrology

## 2019-08-07 DIAGNOSIS — N189 Chronic kidney disease, unspecified: Secondary | ICD-10-CM | POA: Diagnosis not present

## 2019-08-07 DIAGNOSIS — D631 Anemia in chronic kidney disease: Secondary | ICD-10-CM | POA: Insufficient documentation

## 2019-08-07 MED ORDER — SODIUM CHLORIDE 0.9 % IV SOLN
510.0000 mg | INTRAVENOUS | Status: DC
  Administered 2019-08-07: 11:00:00 510 mg via INTRAVENOUS
  Filled 2019-08-07: qty 17

## 2019-08-14 ENCOUNTER — Other Ambulatory Visit: Payer: Self-pay

## 2019-08-14 ENCOUNTER — Encounter (HOSPITAL_COMMUNITY)
Admission: RE | Admit: 2019-08-14 | Discharge: 2019-08-14 | Disposition: A | Discharge status: Home / Self Care | Payer: BC Managed Care – PPO | Source: Ambulatory Visit | Attending: Nephrology | Admitting: Nephrology

## 2019-08-14 DIAGNOSIS — N189 Chronic kidney disease, unspecified: Secondary | ICD-10-CM | POA: Diagnosis not present

## 2019-08-14 MED ORDER — SODIUM CHLORIDE 0.9 % IV SOLN
510.0000 mg | INTRAVENOUS | Status: DC
  Administered 2019-08-14: 11:00:00 510 mg via INTRAVENOUS
  Filled 2019-08-14: qty 17

## 2020-05-27 ENCOUNTER — Ambulatory Visit (INDEPENDENT_AMBULATORY_CARE_PROVIDER_SITE_OTHER): Payer: BLUE CROSS/BLUE SHIELD | Admitting: Neurology

## 2020-05-27 ENCOUNTER — Encounter: Payer: Self-pay | Admitting: Neurology

## 2020-05-27 ENCOUNTER — Other Ambulatory Visit: Payer: Self-pay

## 2020-05-27 VITALS — BP 98/64 | HR 79 | Ht 66.0 in | Wt 197.0 lb

## 2020-05-27 DIAGNOSIS — G43109 Migraine with aura, not intractable, without status migrainosus: Secondary | ICD-10-CM | POA: Diagnosis not present

## 2020-05-27 MED ORDER — ONDANSETRON 4 MG PO TBDP: 4.0000 mg | ORAL_TABLET | Freq: Three times a day (TID) | ORAL | 6 refills | Status: DC | PRN

## 2020-05-27 MED ORDER — SUMATRIPTAN SUCCINATE 100 MG PO TABS: 100.0000 mg | ORAL_TABLET | Freq: Once | ORAL | 6 refills | Status: DC | PRN

## 2020-05-27 MED ORDER — TOPIRAMATE 50 MG PO TABS: 100.0000 mg | ORAL_TABLET | Freq: Two times a day (BID) | ORAL | 11 refills | Status: DC

## 2020-05-27 MED ORDER — TOPIRAMATE 50 MG PO TABS: 100.0000 mg | ORAL_TABLET | Freq: Every day | ORAL | 11 refills | Status: AC

## 2020-05-27 NOTE — Progress Notes (Signed)
PATIENT: Julia Hall DOB: 07-28-1990  Chief Complaint  Patient presents with  . Follow-up    She was last seen in 09/2018. Returning for worsening headache frequency. Reports having a headache nearly every day for the last month. She has not been taking anything for the pain. She stopped Maxalt while she was pregnant but delivered in July of 2021. She would like to discuss restarting medication.    ASSESSMENT AND PLAN  Quaneshia Wareing is a 30 y.o. female   Chronic migraine with aura  Worsening migraine headaches, keep Topamax 50 mg 2 tablets every night as preventive medications  Suboptimal response to Maxalt,   Will change to Imitrex 100 mg as needed, Zofran as needed for nausea  Return to clinic in 6 months with Julia Hall   DIAGNOSTIC DATA (LABS, IMAGING, TESTING) - I reviewed patient records, labs, notes, testing and imaging myself where available.  Laboratory evaluation in July 2021: Hemoglobin of 11.5, negative RPR,  PHYSICAL EXAM   Vitals:   05/27/20 1341  BP: 98/64  Pulse: 79  Weight: 197 lb (89.4 kg)  Height: 5\' 6"  (1.676 m)   Not recorded     Body mass index is 31.8 kg/m.  PHYSICAL EXAMNIATION:  Gen: NAD, conversant, well nourised, obese, well groomed                     Cardiovascular: Regular rate rhythm, no peripheral edema, warm, nontender. Eyes: Conjunctivae clear without exudates or hemorrhage Neck: Supple, no carotid bruits. Pulmonary: Clear to auscultation bilaterally   NEUROLOGICAL EXAM:  MENTAL STATUS: Speech:    Speech is normal; fluent and spontaneous with normal comprehension.  Cognition:     Orientation to time, place and person     Normal recent and remote memory     Normal Attention span and concentration     Normal Language, naming, repeating,spontaneous speech     Fund of knowledge   CRANIAL NERVES: CN II: Visual fields are full to confrontation. Fundoscopic exam is normal with sharp discs and no vascular changes. Pupils  are round equal and briskly reactive to light. CN III, IV, VI: extraocular movement are normal. No ptosis. CN V: Facial sensation is intact to pinprick in all 3 divisions bilaterally. Corneal responses are intact.  CN VII: Face is symmetric with normal eye closure and smile. CN VIII: Hearing is normal to rubbing fingers CN IX, X: Palate elevates symmetrically. Phonation is normal. CN XI: Head turning and shoulder shrug are intact CN XII: Tongue is midline with normal movements and no atrophy.  MOTOR: There is no pronator drift of out-stretched arms. Muscle bulk and tone are normal. Muscle strength is normal.  REFLEXES: Reflexes are 2+ and symmetric at the biceps, triceps, knees, and ankles. Plantar responses are flexor.  SENSORY: Intact to light touch, pinprick, positional sensation and vibratory sensation are intact in fingers and toes.  COORDINATION: Rapid alternating movements and fine finger movements are intact. There is no dysmetria on finger-to-nose and heel-knee-shin.    GAIT/STANCE: Posture is normal. Gait is steady with normal steps, base, arm swing, and turning. Heel and toe walking are normal. Tandem gait is normal.  Romberg is absent.   HISTORICAL  Julia Hall is a 30 year old female, seen in request by her nephrologist 30 for evaluation of migraine headaches, initial evaluation was on September 28, 2018.  Her primary care physician is Dr. September 30, 2018  Past medical history of lupus, hematuria, is treated with Plaquenil, also has  history of hypertension,  She reported history of migraine headaches since 14, her typical migraine are often preceded by blurry vision, followed by lateralized severe pounding headache with associated light, noise sensitivity, lasting for few hours to few days, she tried over-the-counter Tylenol without helping her symptoms  She was not able to identify a trigger for migraine, it happens sporadically, sometimes has few  headaches in a month, but oftentimes can spreading few months without recurrent headaches  Laboratory evaluations in June 2020, creatinine 0.65, normal CMP, CBC hemoglobin of 13.2, ANA was positive, elevated NT double-stranded DNA 43, normal C-reactive protein of 4, ferritin level of 37,  UPDATE May 27 2020: She is on low dose imuran 50mg  daily, is no longer taking Plaquenil, her connective tissue disorder is under good control  She complains of increased headaches since 2022, becomes daily since March, mostly on the left side, moderate to severe, pressure, sometimes exacerbated to throbbing headache with light noise sensitivity, nauseous, movement make it worse, about once a week, lasting hours or longer  Topamax was started recently, which has been helpful, nowtaking 100 mg every night, tolerating it well Reported suboptimal response with Maxalt  REVIEW OF SYSTEMS: Full 14 system review of systems performed and notable only for as above All other review of systems were negative.  ALLERGIES: Allergies  Allergen Reactions  . Penicillins Rash   HOME MEDICATIONS: Current Outpatient Medications  Medication Sig Dispense Refill  . azaTHIOprine (IMURAN) 50 MG tablet Take 50 mg by mouth daily.    . cetirizine (ZYRTEC) 10 MG tablet Take 10 mg by mouth daily as needed for allergies.     . ferrous gluconate (FERGON) 324 MG tablet Take 324 mg by mouth daily.    . folic acid (FOLVITE) 1 MG tablet Take 1 mg by mouth daily.    . hydrocortisone 2.5 % cream Apply 1 application topically daily.    April losartan (COZAAR) 25 MG tablet Take 25 mg by mouth daily.    Marland Kitchen triamcinolone cream (KENALOG) 0.1 % Apply 1 application topically 2 (two) times daily as needed. For eczema.     No current facility-administered medications for this visit.    PAST MEDICAL HISTORY: Past Medical History:  Diagnosis Date  . Hematuria   . Hypertension   . Lupus nephritis (HCC) 2017  . Lupus nephritis, ISN/RPS class IV  (HCC)   . Migraine   . Proteinuria 2017  . Seasonal allergies     PAST SURGICAL HISTORY: Past Surgical History:  Procedure Laterality Date  . NO PAST SURGERIES      FAMILY HISTORY: Family History  Problem Relation Age of Onset  . Other Mother        unknown medical history  . Hypertension Father     SOCIAL HISTORY: Social History   Socioeconomic History  . Marital status: Single    Spouse name: Not on file  . Number of children: 2  . Years of education: college  . Highest education level: Bachelor's degree (e.g., BA, AB, BS)  Occupational History  . Occupation: 2018  Tobacco Use  . Smoking status: Former Smoker    Types: Cigarettes  . Smokeless tobacco: Never Used  Vaping Use  . Vaping Use: Never used  Substance and Sexual Activity  . Alcohol use: Yes    Comment: occasional use  . Drug use: No  . Sexual activity: Not on file  Other Topics Concern  . Not on file  Social History Narrative   Lives  with at home with her son.   Right-handed.   Caffeine use:  One soda daily, occasional energy drink   Social Determinants of Health   Financial Resource Strain: Not on file  Food Insecurity: Not on file  Transportation Needs: Not on file  Physical Activity: Not on file  Stress: Not on file  Social Connections: Not on file  Intimate Partner Violence: Not on file       Levert Feinstein, M.D. Ph.D.  [facility] [address], [address] [place], [place] [address] Ph: [phone] Fax: [phone]  CC: Bufford Buttner, MD

## 2020-12-12 ENCOUNTER — Encounter: Payer: Self-pay | Admitting: Neurology

## 2020-12-12 ENCOUNTER — Ambulatory Visit: Payer: BLUE CROSS/BLUE SHIELD | Admitting: Neurology

## 2021-08-13 ENCOUNTER — Other Ambulatory Visit (HOSPITAL_COMMUNITY): Payer: Self-pay | Admitting: Nephrology

## 2021-08-13 ENCOUNTER — Other Ambulatory Visit: Payer: Self-pay | Admitting: Nephrology

## 2021-08-13 DIAGNOSIS — M3214 Glomerular disease in systemic lupus erythematosus: Secondary | ICD-10-CM

## 2021-09-02 ENCOUNTER — Other Ambulatory Visit (HOSPITAL_COMMUNITY): Payer: Self-pay | Admitting: Radiology

## 2021-09-02 NOTE — Consult Note (Signed)
Chief Complaint: Patient was seen in consultation today for lupus nephritis  Referring Physician(s): Upton,Elizabeth  Supervising Physician: Marliss Coots  Patient Status: [facility] - Out-pt  History of Present Illness: Julia Hall is a 31 y.o. female with history of lupus with proteinuria.  She has previously achieved good control, however recent lab work from April showed worsening proteinuria despite medication changes. Request made for random renal biopsy by Dr. Signe Colt.   Patient presents to [facility] today in her usual state of health.  She is familiar with renal biopsy from prior biopsy in 2017. She is understanding of the goals of the procedure today and is agreeable to proceed.   She has been NPO.  She has taken her blood pressure medication.  Her fiance is available for transportation and care at home.    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 Authorizing Provider  acetaminophen (TYLENOL) 500 MG tablet Take 1,000 mg by mouth every 6 (six) hours as needed for moderate pain.   Yes [provider]  aspirin EC 81 MG tablet Take 81 mg by mouth daily. Swallow whole.   Yes [provider]  cetirizine (ZYRTEC) 10 MG tablet Take 10 mg by mouth daily as needed for allergies.    Yes [provider]  ferrous gluconate (FERGON) 324 MG tablet Take 324 mg by mouth 2 (two) times daily.   Yes [provider]  fluticasone (FLONASE) 50 MCG/ACT nasal spray Place 1 spray into both nostrils daily.   Yes [provider]  folic acid (FOLVITE) 1 MG tablet Take 1 mg by mouth daily.   Yes [provider]  hydroxychloroquine (PLAQUENIL) 200 MG tablet Take 400 mg by mouth daily.   Yes [provider]  ibuprofen (ADVIL) 200 MG tablet Take 400 mg by mouth every 6 (six) hours as needed for moderate pain or headache.   Yes [provider]  losartan (COZAAR) 25 MG tablet Take 25 mg by mouth daily. 12/04/16  Yes [provider]  mycophenolate (MYFORTIC) 360 MG TBEC EC tablet Take 720 mg by mouth 2 (two) times daily.   Yes [provider]  triamcinolone (KENALOG) 0.025 % ointment Apply 1 Application topically 2 (two) times daily as needed (eczema).   Yes [provider]  topiramate (TOPAMAX) 50 MG tablet Take 2 tablets (100 mg total) by mouth at bedtime. Please cancel previous Topamax 50mg  bid order. Patient not taking: Reported on 08/29/2021 05/27/20   07/27/20, MD     Family History  Problem Relation Age of Onset   Other Mother        unknown medical history   Hypertension Father     Social History   Socioeconomic History   Marital status: Single    Spouse name: Not on file   Number of children: 2   Years of education: college   Highest education level: Bachelor's degree (e.g., BA, AB, BS)  Occupational History   Occupation: [employer]  Tobacco Use   Smoking status: Former    Types: Cigarettes   Smokeless tobacco:  Never  Vaping Use   Vaping Use: Never used  Substance and Sexual Activity   Alcohol use: Yes    Comment: occasional use   Drug use: No   Sexual activity: Not on file  Other Topics Concern   Not on file  Social History Narrative   Lives with at home with her son.   Right-handed.   Caffeine use:  One soda daily, occasional energy drink   Social Determinants of Health   Financial Resource Strain: Not on file  Food Insecurity: Not on file  Transportation Needs: Not on file  Physical Activity: Not on file  Stress: Not on file  Social Connections: Not on file     Review of Systems: A 12 point ROS  discussed and pertinent positives are indicated in the HPI above.  All other systems are negative.  Review of Systems  Constitutional:  Negative for fatigue and fever.  Respiratory:  Negative for cough and shortness of breath.   Cardiovascular:  Negative for chest pain.  Gastrointestinal:  Negative for abdominal pain, nausea and vomiting.  Genitourinary:  Negative for dysuria.  Musculoskeletal:  Negative for back pain.  Psychiatric/Behavioral:  Negative for behavioral problems and confusion.     Vital Signs: BP 109/85   Pulse 77   Temp 98 F (36.7 C) (Temporal)   Resp 18   Ht 5\' 6"  (1.676 m)   Wt 196 lb (88.9 kg)   SpO2 98%   BMI 31.64 kg/m   Physical Exam Vitals and nursing note reviewed.  Constitutional:      General: She is not in acute distress.    Appearance: Normal appearance. She is not ill-appearing.  HENT:     Mouth/Throat:     Mouth: Mucous membranes are moist.     Pharynx: Oropharynx is clear.  Cardiovascular:     Rate and Rhythm: Normal rate and regular rhythm.  Pulmonary:     Effort: Pulmonary effort is normal.     Breath sounds: Normal breath sounds.  Abdominal:     General: Abdomen is flat.     Palpations: Abdomen is soft.  Skin:    General: Skin is warm and dry.  Neurological:     General: No focal deficit present.     Mental Status: She is alert and oriented to person, place, and time. Mental status is at baseline.  Psychiatric:        Mood and Affect: Mood normal.        Behavior: Behavior normal.        Thought Content: Thought content normal.        Judgment: Judgment normal.      MD Evaluation Airway: WNL Heart: WNL Abdomen: WNL Chest/ Lungs: WNL ASA  Classification: 3 Mallampati/Airway Score: Two   Imaging: No results found.  Labs:  CBC: Recent Labs    09/03/21 0722  WBC 4.0  HGB 13.7  HCT 39.9  PLT 128*    COAGS: No results for input(s): "INR", "APTT" in the last 8760 hours.  BMP: No results for input(s): "NA",  "K", "CL", "CO2", "GLUCOSE", "BUN", "CALCIUM", "CREATININE", "GFRNONAA", "GFRAA" in the last 8760 hours.  Invalid input(s): "CMP"  LIVER FUNCTION TESTS: No results for input(s): "BILITOT", "AST", "ALT", "ALKPHOS", "PROT", "ALBUMIN" in the last 8760 hours.  TUMOR MARKERS: No results for input(s): "AFPTM", "CEA", "CA199", "CHROMGRNA" in the last 8760 hours.  Assessment and Plan: Patient with past medical history of lupus presents with complaint of proteinuria in the setting of  lupus nephritis.  IR consulted for random renal biopsy at the request of Dr. Signe Colt. Case reviewed by Dr. Elby Showers who approves patient for procedure.  Patient presents today in their usual state of health.  She has been NPO and is not currently on blood thinners.   Risks and benefits was discussed with the patient and/or patient's family including, but not limited to bleeding, infection, damage to adjacent structures or low yield requiring additional tests.  All of the questions were answered and there is agreement to proceed.  Consent signed and in chart.  Thank you for this interesting consult.  I greatly enjoyed meeting Julia Hall and look forward to participating in their care.  A copy of this report was sent to the requesting provider on this date.  Electronically Signed: Hoyt Koch, PA 09/03/2021, 8:07 AM   I spent a total of  30 Minutes   in face to face in clinical consultation, greater than 50% of which was counseling/coordinating care for proteinuria, lupus nephritis.

## 2021-09-03 ENCOUNTER — Encounter (HOSPITAL_COMMUNITY): Payer: Self-pay

## 2021-09-03 ENCOUNTER — Ambulatory Visit (HOSPITAL_COMMUNITY)
Admission: RE | Admit: 2021-09-03 | Discharge: 2021-09-03 | Disposition: A | Discharge status: Home / Self Care | Payer: BC Managed Care – PPO | Source: Ambulatory Visit | Attending: Nephrology | Admitting: Nephrology

## 2021-09-03 ENCOUNTER — Other Ambulatory Visit: Payer: Self-pay

## 2021-09-03 VITALS — BP 104/67 | HR 68 | Temp 98.0°F | Resp 18 | Ht 66.0 in | Wt 196.0 lb

## 2021-09-03 DIAGNOSIS — M3214 Glomerular disease in systemic lupus erythematosus: Secondary | ICD-10-CM | POA: Insufficient documentation

## 2021-09-03 DIAGNOSIS — R809 Proteinuria, unspecified: Secondary | ICD-10-CM | POA: Diagnosis not present

## 2021-09-03 DIAGNOSIS — N183 Chronic kidney disease, stage 3 unspecified: Secondary | ICD-10-CM

## 2021-09-03 LAB — CBC
HCT: 39.9 % (ref 36.0–46.0)
Hemoglobin: 13.7 g/dL (ref 12.0–15.0)
MCH: 28 pg (ref 26.0–34.0)
MCHC: 34.3 g/dL (ref 30.0–36.0)
MCV: 81.4 fL (ref 80.0–100.0)
Platelets: 128 10*3/uL — ABNORMAL LOW (ref 150–400)
RBC: 4.9 MIL/uL (ref 3.87–5.11)
RDW: 12.9 % (ref 11.5–15.5)
WBC: 4 10*3/uL (ref 4.0–10.5)
nRBC: 0 % (ref 0.0–0.2)

## 2021-09-03 LAB — PROTIME-INR
INR: 0.9 (ref 0.8–1.2)
Prothrombin Time: 12.3 seconds (ref 11.4–15.2)

## 2021-09-03 MED ORDER — LIDOCAINE HCL (PF) 1 % IJ SOLN
INTRAMUSCULAR | Status: AC
  Filled 2021-09-03: qty 30

## 2021-09-03 MED ORDER — MIDAZOLAM HCL 2 MG/2ML IJ SOLN
INTRAMUSCULAR | Status: AC
  Filled 2021-09-03: qty 2

## 2021-09-03 MED ORDER — FENTANYL CITRATE (PF) 100 MCG/2ML IJ SOLN
INTRAMUSCULAR | Status: AC | PRN
  Administered 2021-09-03 (×2): 50 ug via INTRAVENOUS

## 2021-09-03 MED ORDER — MIDAZOLAM HCL 2 MG/2ML IJ SOLN
INTRAMUSCULAR | Status: AC | PRN
  Administered 2021-09-03 (×2): 1 mg via INTRAVENOUS

## 2021-09-03 MED ORDER — GELATIN ABSORBABLE 12-7 MM EX MISC
CUTANEOUS | Status: AC
  Filled 2021-09-03: qty 1

## 2021-09-03 MED ORDER — FENTANYL CITRATE (PF) 100 MCG/2ML IJ SOLN
INTRAMUSCULAR | Status: AC
  Filled 2021-09-03: qty 2

## 2021-09-03 NOTE — Progress Notes (Signed)
Patient was given discharge instructions. She verbalized understanding. 

## 2021-09-03 NOTE — Sedation Documentation (Signed)
Small hematoma noted post scan. No changes within 5 min. Dr. Elby Showers notified. Patient lying of left side with compression applied. Denies any pain or nausea at this time.

## 2021-09-03 NOTE — Procedures (Signed)
Interventional Radiology Procedure Note  Procedure: Ultrasound guided left renal biopsy  Findings: Please refer to procedural dictation for full description. 16 ga core x2 from left inferior pole.  Gelfoam slurry needle track embolization.  Complications: Small pericapsular hematoma, self limiting.  Estimated Blood Loss: < 5 mL  Recommendations: Strict 4 hour bedrest. If vitals stable after 2 hours, may resume normal diet. Follow up Pathology results.   Marliss Coots, MD

## 2021-09-08 ENCOUNTER — Telehealth: Payer: BC Managed Care – PPO | Admitting: Physician Assistant

## 2021-09-08 DIAGNOSIS — M545 Low back pain, unspecified: Secondary | ICD-10-CM | POA: Diagnosis not present

## 2021-09-08 MED ORDER — NAPROXEN 500 MG PO TABS: 500.0000 mg | ORAL_TABLET | Freq: Two times a day (BID) | ORAL | 0 refills | Status: AC

## 2021-09-08 NOTE — Progress Notes (Signed)
We are sorry that you are not feeling well.  Here is how we plan to help!  Based on what you have shared with me it looks like you mostly have acute back pain.  Acute back pain is defined as musculoskeletal pain that can resolve in 1-3 weeks with conservative treatment.  I have prescribed Naprosyn 500 mg take one by mouth twice a day non-steroid anti-inflammatory (NSAID).  Some patients experience stomach irritation or in increased heartburn with anti-inflammatory drugs.    It is possible your back pain is secondary to inflammation and bruising from the biopsy procedure. I would recommend the naproxen as prescribed and tylenol for pain. You can also apply a heating pad to the area. If your pain does not improve or if it worsens over the next 72 hours, please seek in person evaluation.   Home Care Stay active.  Start with short walks on flat ground if you can.  Try to walk farther each day. Do not sit, drive or stand in one place for more than 30 minutes.  Do not stay in bed. Do not avoid exercise or work.  Activity can help your back heal faster. Be careful when you bend or lift an object.  Bend at your knees, keep the object close to you, and do not twist. Sleep on a firm mattress.  Lie on your side, and bend your knees.  If you lie on your back, put a pillow under your knees. Only take medicines as told by your doctor. Put ice on the injured area. Put ice in a plastic bag Place a towel between your skin and the bag Leave the ice on for 15-20 minutes, 3-4 times a day for the first 2-3 days. 210 After that, you can switch between ice and heat packs. Ask your doctor about back exercises or massage. Avoid feeling anxious or stressed.  Find good ways to deal with stress, such as exercise.  Get Help Right Way If: Your pain does not go away with rest or medicine. Your pain does not go away in 1 week. You have new problems. You do not feel well. The pain spreads into your legs. You cannot  control when you poop (bowel movement) or pee (urinate) You feel sick to your stomach (nauseous) or throw up (vomit) You have belly (abdominal) pain. You feel like you may pass out (faint). If you develop a fever.  Make Sure you: Understand these instructions. Will watch your condition Will get help right away if you are not doing well or get worse.  Your e-visit answers were reviewed by a board certified advanced clinical practitioner to complete your personal care plan.  Depending on the condition, your plan could have included both over the counter or prescription medications.  If there is a problem please reply  once you have received a response from your provider.  Your safety is important to [place].  If you have drug allergies check your prescription carefully.    You can use MyChart to ask questions about today's visit, request a non-urgent call back, or ask for a work or school excuse for 24 hours related to this e-Visit. If it has been greater than 24 hours you will need to follow up with your provider, or enter a new e-Visit to address those concerns.  You will get an e-mail in the next two days asking about your experience.  I hope that your e-visit has been valuable and will speed your recovery. Thank you for  using e-visits.  I provided 5 minutes of non face-to-face time during this encounter for chart review and documentation.

## 2021-09-10 ENCOUNTER — Other Ambulatory Visit: Payer: Self-pay | Admitting: Nephrology

## 2021-09-10 DIAGNOSIS — M3214 Glomerular disease in systemic lupus erythematosus: Secondary | ICD-10-CM

## 2021-09-11 ENCOUNTER — Encounter (HOSPITAL_COMMUNITY): Payer: Self-pay

## 2021-09-12 ENCOUNTER — Inpatient Hospital Stay: Admission: RE | Admit: 2021-09-12 | Payer: BC Managed Care – PPO | Source: Ambulatory Visit

## 2021-09-12 LAB — SURGICAL PATHOLOGY

## 2021-09-15 ENCOUNTER — Other Ambulatory Visit: Payer: BC Managed Care – PPO

## 2022-10-12 ENCOUNTER — Other Ambulatory Visit: Payer: Self-pay | Admitting: Nephrology

## 2022-10-12 DIAGNOSIS — R109 Unspecified abdominal pain: Secondary | ICD-10-CM

## 2022-10-16 ENCOUNTER — Other Ambulatory Visit: Payer: BC Managed Care – PPO

## 2023-03-18 NOTE — Progress Notes (Deleted)
 NEW PATIENT Date of Service/Encounter:  03/18/23 Referring provider: Bufford Buttner, MD Primary care provider: Bufford Buttner, MD  Subjective:  Julia Hall is a 33 y.o. female with a PMHx of lupus nephritis, HTN presenting today for evaluation of rash. History obtained from: chart review and {Persons; PED relatives w/patient:19415::"patient"}.   Discussed the use of AI scribe software for clinical note transcription with the patient, who gave verbal consent to proceed.  History of Present Illness            Chart Review:  Reviewed PCP notes from referral 02/01/23: dermatitis-described by provider as linear papules.  Other allergy screening: Asthma: {Blank single:19197::"yes","no"} Rhino conjunctivitis: {Blank single:19197::"yes","no"} Food allergy: {Blank single:19197::"yes","no"} Medication allergy: {Blank single:19197::"yes","no"} Hymenoptera allergy: {Blank single:19197::"yes","no"} Urticaria: {Blank single:19197::"yes","no"} Eczema:{Blank single:19197::"yes","no"} History of recurrent infections suggestive of immunodeficency: {Blank single:19197::"yes","no"} ***Vaccinations are up to date.   Past Medical History: Past Medical History:  Diagnosis Date   Hematuria    Hypertension    Lupus nephritis (HCC) 2017   Lupus nephritis, ISN/RPS class IV (HCC)    Migraine    Proteinuria 2017   Seasonal allergies    Medication List:  Current Outpatient Medications  Medication Sig Dispense Refill   acetaminophen (TYLENOL) 500 MG tablet Take 1,000 mg by mouth every 6 (six) hours as needed for moderate pain.     aspirin EC 81 MG tablet Take 81 mg by mouth daily. Swallow whole.     cetirizine (ZYRTEC) 10 MG tablet Take 10 mg by mouth daily as needed for allergies.      ferrous gluconate (FERGON) 324 MG tablet Take 324 mg by mouth 2 (two) times daily.     fluticasone (FLONASE) 50 MCG/ACT nasal spray Place 1 spray into both nostrils daily.     folic acid (FOLVITE) 1 MG  tablet Take 1 mg by mouth daily.     hydroxychloroquine (PLAQUENIL) 200 MG tablet Take 400 mg by mouth daily.     ibuprofen (ADVIL) 200 MG tablet Take 400 mg by mouth every 6 (six) hours as needed for moderate pain or headache.     losartan (COZAAR) 25 MG tablet Take 25 mg by mouth daily.     mycophenolate (MYFORTIC) 360 MG TBEC EC tablet Take 720 mg by mouth 2 (two) times daily.     naproxen (NAPROSYN) 500 MG tablet Take 1 tablet (500 mg total) by mouth 2 (two) times daily with a meal. 30 tablet 0   topiramate (TOPAMAX) 50 MG tablet Take 2 tablets (100 mg total) by mouth at bedtime. Please cancel previous Topamax 50mg  bid order. (Patient not taking: Reported on 08/29/2021) 60 tablet 11   triamcinolone (KENALOG) 0.025 % ointment Apply 1 Application topically 2 (two) times daily as needed (eczema).     No current facility-administered medications for this visit.   Known Allergies:  Allergies  Allergen Reactions   Penicillins Rash    Has patient had a PCN reaction causing immediate rash, facial/tongue/throat swelling, SOB or lightheadedness with hypotension: {Yes/No:30480221}No Has patient had a PCN reaction causing severe rash involving mucus membranes or skin necrosis: {Yes/No:30480221}No Has patient had a PCN reaction that required hospitalization {Yes/No:30480221}NO Has patient had a PCN reaction occurring within the last 10 years: {Yes/No:30480221}No If all of the above answers are "NO", then may proceed with Cephalosporin    Past Surgical History: Past Surgical History:  Procedure Laterality Date   NO PAST SURGERIES     Family History: Family History  Problem Relation Age of Onset  Other Mother        unknown medical history   Hypertension Father    Social History: Shariah lives ***.   ROS:  All other systems negative except as noted per HPI.  Objective:  There were no vitals taken for this visit. There is no height or weight on file to calculate BMI. Physical  Exam:  General Appearance:  Alert, cooperative, no distress, appears stated age  Head:  Normocephalic, without obvious abnormality, atraumatic  Eyes:  Conjunctiva clear, EOM's intact  Ears {Blank multiple:19196:a:"***","EACs normal bilaterally","normal TMs bilaterally","ear tubes present bilaterally without exudate"}  Nose: Nares normal, {Blank multiple:19196:a:"***","hypertrophic turbinates","normal mucosa","no visible anterior polyps","septum midline"}  Throat: Lips, tongue normal; teeth and gums normal, {Blank multiple:19196:a:"***","normal posterior oropharynx","tonsils 2+","tonsils 3+","no tonsillar exudate","+ cobblestoning","surgically absent tonsils","mildly erythematous posterior oropharynx"}  Neck: Supple, symmetrical  Lungs:   {Blank multiple:19196:a:"***","clear to auscultation bilaterally","end-expiratory wheezing","wheezing throughout"}, Respirations unlabored, {Blank multiple:19196:a:"***","no coughing","intermittent dry coughing","intermittent productive-sounding cough"}  Heart:  {Blank multiple:19196:a:"***","regular rate and rhythm","no murmur"}, Appears well perfused  Extremities: No edema  Skin: {Blank multiple:19196:a:"***","erythematous, dry patches scattered on ***","lichenification on ***","Skin color, texture, turgor normal","no rashes or lesions on visualized portions of skin"}  Neurologic: No gross deficits   Diagnostics: Spirometry:  Tracings reviewed. Her effort: {Blank single:19197::"Good reproducible efforts.","It was hard to get consistent efforts and there is a question as to whether this reflects a maximal maneuver.","Poor effort, data can not be interpreted.","Variable effort-results affected","effort okay for first attempt at spirometry.","Results not reproducible due to ***"} FVC: ***L (pre), ***L  (post) FEV1: ***L, ***% predicted (pre), ***L, ***% predicted (post) FEV1/FVC ratio: *** (pre), *** (post) Interpretation: {Blank single:19197::"Spirometry  consistent with mild obstructive disease","Spirometry consistent with moderate obstructive disease","Spirometry consistent with severe obstructive disease","Spirometry consistent with possible restrictive disease","Spirometry consistent with mixed obstructive and restrictive disease","Spirometry uninterpretable due to technique","Spirometry consistent with normal pattern","No overt abnormalities noted given today's efforts","Nonobstructive ratio, low FEV1","Nonobstructive ratio, low FEV1, possible restriction"}.  Please see scanned spirometry results for details.  Skin Testing: {Blank single:19197::"Select foods","Environmental allergy panel","Environmental allergy panel and select foods","Food allergy panel","None","Deferred due to recent antihistamines use","deferred due to recent reaction","Pediatric Environmental Allergy Panel","Pediatric Food Panel","Select foods and environmental allergies"}. {Blank single:19197::"Adequate positive and negative controls","Inadequate positive control-testing invalid","Adequate positive and negative controls, dermatographism present, testing difficult to interpret"}. Results discussed with patient/family.   {Blank single:19197::"Allergy testing results were read and interpreted by myself, documented by clinical staff.","Allergy testing results were read by ***,FNP, documented by clinical staff"}  Labs:  Lab Orders  No laboratory test(s) ordered today     Assessment and Plan  Assessment and Plan               {Blank single:19197::"This note in its entirety was forwarded to the Provider who requested this consultation."}  Other: {Blank multiple:19196:a:"***","samples provided of: ***","school forms provided","reviewed spirometry technique","reviewed inhaler technique"}  Thank you for your kind referral. I appreciate the opportunity to take part in Alonni's care. Please do not hesitate to contact me with questions.***  Sincerely,  Tonny Bollman,  MD [facility]

## 2023-03-19 ENCOUNTER — Ambulatory Visit: Payer: BLUE CROSS/BLUE SHIELD | Admitting: Internal Medicine

## 2023-06-22 ENCOUNTER — Encounter: Payer: Self-pay | Admitting: Neurology

## 2023-07-21 NOTE — Progress Notes (Signed)
 [facility]- Established Patient   Patient:  Julia Hall DOB: 1990/05/25 PCP:  Manuelita KATHEE Camps, FNP  Date of Encounter:  07/21/2023  Computer technology was used to create visit note. Consent from the patient/caregiver was obtained prior to its use.   ASSESSMENT AND PLAN  Julia Hall is a 33 y.o. female with history of   who comes today for established follow up. Most recent labs, images and consultants notes reviewed.  Assessment & Plan Systemic lupus: - She is stable from lupus standpoint on combination of Benlysta and hydroxychloroquine.  She will be continued on it.  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  Today, I assessed for side effects, infections, new or worsening health conditions that may be related to the medications, and will obtain labs.  Neck pain and lower back pain - X-ray of the lumbar and cervical spine will be obtained.  Patient has a referral to see a spine specialist in September 2025   I will also repeat her lupus markers including urinalysis and urine protein creatinine ratio    1. SLE (systemic lupus erythematosus related syndrome) (*)   2. Chronic bilateral low back pain without sciatica   3. Neck pain       SUBJECTIVE   Chief Complain:  Chief Complaint  Patient presents with  . SLE (systemic lupus erythematosus related syndrome) (*)    The patient states she is doing good. She still on HCQ and benlysta.    History of Present Illness Patient has been stable since the last visit she denies any major flareups.  I did review her labs from nephrology visit from March 2025 that showed mildly elevated double-stranded DNA, stable complement levels.  She did have some hematuria and  proteinuria, however, she was having menstrual cycle during that time.  We will repeat her urine test today.  She reports episodic swelling of ankle upon prolonged standing.  She has not had frequent episodes of the skin rash.  The skin rash tends to go away within a day or so and does not leave any hyperpigmentation or pigment change in the skin.  She does report chronic low back pain that radiates to the flank and also the cervical neck pain.  Medication list reviewed and reconciled, non-indicated medications discontinued. Plan explained to patient who verbalized understanding  Active Ambulatory Problems    Diagnosis Date Noted  . Eczema 08/15/2010  . Acne 08/15/2010  . Preventative health care 08/18/2010  . Systemic lupus (*) 08/25/2019  . 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 Chronic hypertension complicating or reason for care during pregnancy, third trimester (*) 01/22/2021  . 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 Chronic migraine with aura 09/28/2018   Resolved Ambulatory Problems    Diagnosis Date Noted  . Candidiasis of skin and nails 12/04/2008  . URI, acute 08/15/2010  . Posterior cervical adenopathy 08/15/2010  . Headache 01/13/2012  . Pregnancy (*) 12/23/2015  . SVD (spontaneous vaginal delivery) (*) 12/25/2015  . Encounter for induction of labor (*) 08/22/2019  . NSVD (normal spontaneous vaginal delivery) (*) 08/25/2019  .  Single live birth (*) 08/25/2019  . Postpartum care following vaginal delivery (*) 08/25/2019  . [redacted] weeks gestation of pregnancy (*) 01/22/2021  . Systemic lupus erythematosus affecting pregnancy in third trimester (*) 01/22/2021   Past Medical History:  Diagnosis Date  . Anemia   . Lupus     ROS:  No abnormal findings beside the aforementioned during HPI   Past Medical History, Past Surgery History, Allergies, Social History, and Family History were reviewed and updated.    OBJECTIVE    Vitals:   07/21/23 0951  BP: 96/66  Pulse: 75  Temp: 97.5 F (36.4 C)  SpO2: 99%   There is no height or weight on file to calculate BMI.  Constitutional: alert and cooperative, and appears to be in no acute distress Eye: Orbits, conjunctiva ans sclera without alterations Ear/Nose/Throat: external ear without secretions, no nasal secretions. Oral cavity normal and wet. No inflammation, swelling, exudate, or lesions. Neck: Neck supple, non-tender. Respiratory: patient breathing room air. Eupneic. Adequate thoracic expansion. Thorax Clear to auscultation. Cardiovascular: Normal S1 and S2. No S3, S4 or murmurs. Rhythm is regular. Normotensive. Gastrointestinal: Positive bowel sounds. Soft, nondistended, nontender. Neurologic: AAO x3, patient is able to follow instructions. CN grossly intact. No focal deficits Psychiatric: normal affect, mood and behavior.  Physical Exam No synovitis/enthesitis or dactylitis No other tender, synovitic, warm, erythematous, or deformed joints. Full range of motion of all joints except as above.    MEDICATIONS   Prior to Admission medications   Medication Sig Start Date End Date Taking? Authorizing Provider  aspirin (ASPRI-LOW,ECOTRIN LOW DOSE) 81 mg EC tablet Take one tablet (81 mg dose) by mouth daily.   Yes Historical Provider, MD  BENLYSTA 200 MG/ML SOAJ SMARTSIG:SUB-Q 07/27/22  Yes Historical Provider, MD  CAMILA 0.35 MG tablet Take one tablet (0.35 mg dose) by mouth daily. 03/07/21  Yes Historical Provider, MD  cetirizine (ZYRTEC) 10 mg tablet Take one tablet (10 mg dose) by mouth daily. 06/05/22 07/16/23  Keitha VEAR Daring, FNP  doxycycline  hyclate (VIBRA -TABS) 100 mg tablet Take one tablet (100 mg dose) by mouth 2 (two) times daily for 10 days. 07/16/23 07/26/23 Yes Michele M Lizano, NP  ferrous gluconate (FERGON) 324 mg tablet Take one tablet (324 mg dose) by mouth 2 (two) times daily after meals.   Yes Historical Provider, MD  fluconazole  (DIFLUCAN) 150 mg tablet Take by mouth. 07/16/23  Yes Historical Provider, MD  fluticasone propionate (FLONASE) 50 mcg/actuation nasal spray one spray by Both Nostrils route daily.   Yes Historical Provider, MD  folic acid 1 mg tablet Take one tablet (1 mg dose) by mouth daily.   Yes Historical Provider, MD  hydroxychloroquine (PLAQUENIL) 200 MG tablet Take one tablet (200 mg dose) by mouth 2 (two) times daily.   Yes Historical Provider, MD  ibuprofen  (ADVIL ,MOTRIN ) 800 mg tablet Take one tablet (800 mg dose) by mouth every 8 (eight) hours as needed for Pain. 04/15/22  Yes Charlies JAYSON Nightingale, NP  losartan potassium (COZAAR) 25 mg tablet Take one tablet (25 mg dose) by mouth daily.   Yes Historical Provider, MD  montelukast (SINGULAIR) 10 MG tablet Take one tablet (10 mg dose) by mouth daily. Patient not taking: Reported on 03/26/2023 10/17/22   Ike JONETTA Shams, PA  Mycophenolate Sodium (MYCOPHENOLIC ACID) 180 MG TBEC Take 1 tablet by mouth 2 (two) times daily. 06/18/22  Yes Historical Provider, MD  MycopheNOLIC acid (MYFORTIC) 360 MG TBEC Take two tablets (720 mg dose) by mouth  2 (two) times daily.   Yes Historical Provider, MD  SUMAtriptan  succinate (IMITREX ) 100 mg tablet Take one tablet (100 mg dose) by mouth 1 (one) time if needed. 05/27/20  Yes Historical Provider, MD  topiramate  (TOPAMAX ) 50 MG tablet Take two tablets (100 mg dose) by mouth as needed. 05/27/20  Yes Historical Provider, MD  triamcinolone (KENALOG) 0.025% ointment APPLY TO AFFECTED AREA TWICE A DAY 06/23/22  Yes Lindsay B Spainhour, FNP  Voclosporin (LUPKYNIS) 7.9 MG CAPS Take 3 capsules by mouth 2 (two) times daily. Patient not taking: Reported on 03/26/2023    Historical Provider, MD     LABORATORY   Lab Results  Component Value Date   WBC 5.5 03/22/2023   Hemoglobin 12.3 03/22/2023   Hematocrit 37.9 03/22/2023   MCV 86 03/22/2023   Platelet Count 250 03/22/2023      Chemistry      Component Value Date/Time   NA 141 04/23/2022  0931   NA 140 03/06/2013 1024   K 3.9 04/23/2022 0931   K 4.3 03/06/2013 1024   CL 104 04/23/2022 0931   CL 103 03/06/2013 1024   CO2 28 04/23/2022 0931   CO2 25 03/06/2013 1024   BUN 8 03/22/2023 1019   BUN 8 04/23/2022 0931   BUN 9 03/06/2013 1024   CREATININE 0.57 03/22/2023 1019   GLUCOSE 100 (H) 04/23/2022 0931   GLUCOSE 92 03/06/2013 1024      Component Value Date/Time   CALCIUM 9.2 04/23/2022 0931   CALCIUM 9.5 11/14/2021 1407   ALKPHOS 75 03/22/2023 1019   ALKPHOS 75 04/23/2022 0931   ALKPHOS 76 03/06/2013 1024   AST 22 03/22/2023 1019   AST 30 04/23/2022 0931   AST 14 03/06/2013 1024   ALT 15 03/22/2023 1019   ALT 26 04/23/2022 0931   ALT 9 03/06/2013 1024   BILITOT 0.3 03/22/2023 1019   BILITOT 0.30 04/23/2022 0931   BILITOT 0.3 03/06/2013 1024      No results found for: ANA No results found for: RF No results found for: SEDRATE No results found for: CRP No results found for: CCP  Urine: Lab Results  Component Value Date   Specific Gravity 1.025 11/09/2022   Urine Glucose Negative 02/23/2016   Urine Bilirubin Negative 02/23/2016   Urobilinogen 0.2 11/09/2022   Nitrite Negative 11/09/2022     IMAGING/DIAGNOSTICS  No images are attached to the encounter.      All new prescription medications, changes in current prescription dosages, and sample medications were discussed with the patient, including patient education, medication name, use, dosage, potential side effects, drug interactions, consequences of not using/taking, and special instructions.  Patient expressed understanding.  No barriers to adherence.  Part of this note could have been generated using voice-recognition software. Despite real-time proof-reading, transcription errors might be present. Please call if there are questions.  Manuela LAMY, MD [facility]

## 2023-08-06 NOTE — Progress Notes (Signed)
 Subjective:    Julia Hall is a 33 y.o. female who presents with complaint of nasal/sinus drainage and congestion and bilateral ear pressure.  Lightheaded symptoms started a week ago.  States that  she has recurrent sinus problem.  Review of records show that she was seen here for sinusitis 3 months ago.  She usually have several episodes in a year.  Elaborates that ear discomfort is not associated with decreased hearing as well as drainage.  Denies recent ear infection as well as recent swimming.  Added that there is a lesion on her nasal septum, right anterior, superior region which has been there for about a month.  Elaborates that the area is tender to touch and is scabbed over.  Denies noticing any drainage.  Added that because of the lesion, she has not been using the Flonase nasal spray but reports taking daily Zyrtec as well as acetaminophen  as needed for the discomfort.  With above-mentioned symptoms, patient denies fever or chills.  The following portions of the patient's history were reviewed and updated as appropriate: allergies, current medications, past family history, past medical history, past social history, past surgical history and problem list.  Patient Active Problem List  Diagnosis  . Eczema  . Acne  . Preventative health care  . Systemic lupus (*)  . Immunodeficiency due to drugs (*)  . Chronic hypertension complicating or reason for care during pregnancy, third trimester (*)  . Carrier of group B Streptococcus  . Lupus nephritis (*)  . Chronic migraine with aura    Review of Systems Review of Systems - All systems reviewed and are negative except what is noted in the HPI.   Objective:   BP 108/80 (BP Location: Right Upper Arm, Patient Position: Sitting)   Pulse 68   Temp 97.6 F (36.4 C) (Tympanic)   Wt 200 lb (90.7 kg)   LMP 06/30/2023 (Exact Date)   SpO2 98% Comment: room air  Breastfeeding No   BMI 32.28 kg/m  General appearance: alert, appears stated  age, cooperative and no distress Head: Normocephalic, without obvious abnormality, atraumatic Eyes: conjunctivae/corneas clear. PERRL, EOM's intact.  Ears: abnormal TM right ear - dull and bulging and abnormal TM left ear - dull and bulging Nose: (+) Scab noted on the superior anterior portion of the left side of the patient's nasal septum.  Right turbinate red, edematous, left turbinate red, edematous.  (+) TTP over patient's sinuses  Throat: Tonsils and tonsillar pillars are nonerythematous and nonswollen.  lips, mucosa, and tongue normal; teeth and gums normal Neck: no adenopathy, no carotid bruit, no JVD, supple, symmetrical, trachea midline and thyroid not enlarged, symmetric, no tenderness/mass/nodules Lungs: clear to auscultation bilaterally Heart: regular rate and rhythm, S1, S2 normal, no murmur, click, rub or gallop Skin: Skin color, texture, turgor normal. No rashes or lesions        Assessment:   1. Recurrent sinusitis  Ambulatory referral to ENT   predniSONE (DELTASONE) 20 mg tablet   azithromycin (ZITHROMAX) 250 mg tablet    2. ETD (Eustachian tube dysfunction), bilateral  Ambulatory referral to ENT   predniSONE (DELTASONE) 20 mg tablet    3. Lesion of nasal septum  predniSONE (DELTASONE) 20 mg tablet   mupirocin (BACTROBAN) 2 % ointment    4. Immunodeficiency due to drugs (*)      5. Systemic lupus erythematosus, unspecified SLE type, unspecified organ involvement status (*)          Plan:   1. Labs and  medications from today's visit: Orders Placed This Encounter  Procedures  . Ambulatory referral to ENT   Outpatient Encounter Medications as of 08/06/2023  Medication Sig Dispense Refill  . aspirin (ASPRI-LOW,ECOTRIN LOW DOSE) 81 mg EC tablet Take one tablet (81 mg dose) by mouth daily.    SABRA azithromycin (ZITHROMAX) 250 mg tablet Take 2 tablets on day 1, then 1 tablet daily for 4 days. 6 tablet 0  . BENLYSTA 200 MG/ML SOAJ once a week.    SABRA CAMILA 0.35 MG tablet  Take one tablet (0.35 mg dose) by mouth daily.    . cetirizine (ZYRTEC) 10 mg tablet Take one tablet (10 mg dose) by mouth daily. 30 tablet 11  . ferrous gluconate (FERGON) 324 mg tablet Take one tablet (324 mg dose) by mouth 2 (two) times daily after meals.    . [DISCONTINUED] fluconazole (DIFLUCAN) 150 mg tablet Take by mouth.    . fluticasone propionate (FLONASE) 50 mcg/actuation nasal spray one spray by Both Nostrils route daily.    . folic acid 1 mg tablet Take one tablet (1 mg dose) by mouth daily.    . hydroxychloroquine (PLAQUENIL) 200 MG tablet Take one tablet (200 mg dose) by mouth 2 (two) times daily.    . ibuprofen  (ADVIL ,MOTRIN ) 800 mg tablet Take one tablet (800 mg dose) by mouth every 8 (eight) hours as needed for Pain. 30 tablet 0  . losartan potassium (COZAAR) 25 mg tablet Take one tablet (25 mg dose) by mouth daily.    . [DISCONTINUED] montelukast (SINGULAIR) 10 MG tablet Take one tablet (10 mg dose) by mouth daily. (Patient not taking: Reported on 03/26/2023) 30 tablet 2  . mupirocin (BACTROBAN) 2 % ointment Apply topically 3 (three) times a day for 7 days. 22 g 0  . [DISCONTINUED] Mycophenolate Sodium (MYCOPHENOLIC ACID) 180 MG TBEC Take 1 tablet by mouth 2 (two) times daily.    . [DISCONTINUED] MycopheNOLIC acid (MYFORTIC) 360 MG TBEC Take two tablets (720 mg dose) by mouth 2 (two) times daily.    . predniSONE (DELTASONE) 20 mg tablet Take two tablets PO once daily with food for five days 10 tablet 0  . [DISCONTINUED] SUMAtriptan  succinate (IMITREX ) 100 mg tablet Take one tablet (100 mg dose) by mouth 1 (one) time if needed.    . [DISCONTINUED] topiramate  (TOPAMAX ) 50 MG tablet Take two tablets (100 mg dose) by mouth as needed.    . triamcinolone (KENALOG) 0.025% ointment APPLY TO AFFECTED AREA TWICE A DAY 30 g 0  . [DISCONTINUED] Voclosporin (LUPKYNIS) 7.9 MG CAPS Take 3 capsules by mouth 2 (two) times daily. (Patient not taking: Reported on 03/26/2023)     No  facility-administered encounter medications on file as of 08/06/2023.   1. Recurrent sinusitis (Primary) Nasal saline spray for congestion. Advised use of OTC nasal spray such as Flonase, Nasocort, Nasonex, etc.  If medication is previously prescribed, advised to resume using said medication. Stressed that medication should be used daily until s/s resolved and not as needed. If using OTC Afrin, informed that medication/nasal spray should not be used more than 3 days in a row to prevent rebound congestion.  - predniSONE (DELTASONE) 20 mg tablet; Take two tablets PO once daily with food for five days  Dispense: 10 tablet; Refill: 0; Advised to take medication with food and full stomach and avoid taking NSAIDs while on the medicine.  - azithromycin (ZITHROMAX) 250 mg tablet; Take 2 tablets on day 1, then 1 tablet daily for 4  days.  Dispense: 6 tablet; Refill: 0 - Ambulatory referral to ENT  2. ETD (Eustachian tube dysfunction), bilateral Discussed examination , diagnosis, as well as rational for her symptom(s) Reassurance given that no ear infection is noted on exam. Informed that problem is d/t PND and mgt is geared towards addressing the cause.  Advised OTC nasal steroid spray to decrease nasal drainage. Advised use of OTC nasal decongestant as needed if tolerated. Informed that sometimes problem can last for weeks, If worsening, patient may need ENT referral  - Ambulatory referral to ENT - predniSONE (DELTASONE) 20 mg tablet; Take two tablets PO once daily with food for five days  Dispense: 10 tablet; Refill: 0  3. Lesion of nasal septum - predniSONE (DELTASONE) 20 mg tablet; Take two tablets PO once daily with food for five days  Dispense: 10 tablet; Refill: 0 - mupirocin (BACTROBAN) 2 % ointment; Apply topically 3 (three) times a day for 7 days.  Dispense: 22 g; Refill: 0  4. Immunodeficiency due to drugs (*) 5. Systemic lupus erythematosus, unspecified SLE type, unspecified organ  involvement status (*) Advised patient to follow up with their PCP this week or if symptoms worsen return here or go to the emergency department for evaluation.  Previsit planning was completed via snapshot and review of chart.  Patient/family verbalized to me that they understood what their problem is, what they need to do about it, and why it is important that they do it.  The patient/family voices understanding of all medications. No barriers to adherence were noted. Patient is taking all medications as prescribed and is tolerating well.  Plan for follow-up as discussed or as needed if any worsening symptoms or change in condition.  After Visit Summary was given to patient.   Documentation for time-based billing:  Total time spent of date of service was 35 minutes.  Patient care activities included preparing to see the patient such as reviewing the patient record, obtaining and/or reviewing separately obtained history, performing a medically appropriate history and physical examination, counseling and educating the patient, family, and/or caregiver, ordering prescription medications, tests, or procedures, documenting clinical information in the electronic or other health record, and addressing patient's questions and concerns.SABRA

## 2023-09-13 ENCOUNTER — Encounter: Payer: Self-pay | Admitting: Neurology

## 2023-10-25 NOTE — Progress Notes (Deleted)
 NEUROLOGY CONSULTATION NOTE  Julia Hall MRN: [id number] DOB: [birth year]  Referring provider: Almarie Bonine, MD Primary care provider: Almarie Bonine, MD  Reason for consult:  headache  Assessment/Plan:   ***   Subjective:  Julia Hall is a 33 year old ***-handed female with lupus nephritis and HTN who presents for headaches.  History supplemented by referring provider's note.  Onset:  *** Location:  *** Quality:  *** Intensity:  ***.  Aura:  *** Prodrome:  *** Postdrome:  *** Associated symptoms:  ***.  *** denies associated unilateral numbness or weakness. Duration:  *** Frequency:  *** Frequency of abortive medication: *** Triggers:  *** Relieving factors:  *** Activity:  ***  Past NSAIDS/analgesics:  naproxen , ibuprofen  Past abortive triptans:  rizatriptan  tab, sumatriptan  tab Past abortive ergotamine:  none Past muscle relaxants:  methocarbamol  Past anti-emetic:  Zofran  Past antihypertensive medications:  *** Past antidepressant medications:  *** Past anticonvulsant medications:  topiramate  Past anti-CGRP:  none Past vitamins/Herbal/Supplements:  none Past antihistamines/decongestants:  none Other past therapies:  ***  Current NSAIDS/analgesics:  Tylenol , ASA 81mg  daily Current triptans:  none Current ergotamine:  none Current anti-emetic:  none Current muscle relaxants:  none Current Antihypertensive medications:  losartan Current Antidepressant medications:  none Current Anticonvulsant medications:  none Current anti-CGRP:  none Current Vitamins/Herbal/Supplements:  ferrous gluconate Current Antihistamines/Decongestants:  Zyrtec Other therapy:  none Birth control:  *** Other medications:  hydroxychloroquine, Benlysta   Caffeine:  *** Alcohol:  *** Smoker:  *** Diet:  *** Exercise:  *** Depression:  ***; Anxiety:  *** Other pain:  *** Sleep hygiene:  ***  History of TBI/concussion:  *** Family history of headache:   *** Family history of cerebral aneurysm:  ***   05/07/2023 CMP:  Na 140, K 4.4, Cl 104, CO2 26, Ca 9.6, glucose 108, BUN 11, Cr 0.59, GFR 123, t ibil 0.2, ALP 87, AST 17, ALT 18.   PAST MEDICAL HISTORY: Past Medical History:  Diagnosis Date   Hematuria    Hypertension    Lupus nephritis (HCC) 2017   Lupus nephritis, ISN/RPS class IV (HCC)    Migraine    Proteinuria 2017   Seasonal allergies     PAST SURGICAL HISTORY: Past Surgical History:  Procedure Laterality Date   NO PAST SURGERIES      MEDICATIONS: Current Outpatient Medications on File Prior to Visit  Medication Sig Dispense Refill   acetaminophen  (TYLENOL ) 500 MG tablet Take 1,000 mg by mouth every 6 (six) hours as needed for moderate pain.     aspirin EC 81 MG tablet Take 81 mg by mouth daily. Swallow whole.     cetirizine (ZYRTEC) 10 MG tablet Take 10 mg by mouth daily as needed for allergies.      ferrous gluconate (FERGON) 324 MG tablet Take 324 mg by mouth 2 (two) times daily.     fluticasone (FLONASE) 50 MCG/ACT nasal spray Place 1 spray into both nostrils daily.     folic acid (FOLVITE) 1 MG tablet Take 1 mg by mouth daily.     hydroxychloroquine (PLAQUENIL) 200 MG tablet Take 400 mg by mouth daily.     ibuprofen  (ADVIL ) 200 MG tablet Take 400 mg by mouth every 6 (six) hours as needed for moderate pain or headache.     losartan (COZAAR) 25 MG tablet Take 25 mg by mouth daily.     mycophenolate (MYFORTIC) 360 MG TBEC EC tablet Take 720 mg by mouth 2 (two) times daily.  naproxen  (NAPROSYN ) 500 MG tablet Take 1 tablet (500 mg total) by mouth 2 (two) times daily with a meal. 30 tablet 0   topiramate  (TOPAMAX ) 50 MG tablet Take 2 tablets (100 mg total) by mouth at bedtime. Please cancel previous Topamax  50mg  bid order. (Patient not taking: Reported on 08/29/2021) 60 tablet 11   triamcinolone (KENALOG) 0.025 % ointment Apply 1 Application topically 2 (two) times daily as needed (eczema).     No current  facility-administered medications on file prior to visit.    ALLERGIES: Allergies  Allergen Reactions   Penicillins Rash    Has patient had a PCN reaction causing immediate rash, facial/tongue/throat swelling, SOB or lightheadedness with hypotension: {Yes/No:30480221}No Has patient had a PCN reaction causing severe rash involving mucus membranes or skin necrosis: {Yes/No:30480221}No Has patient had a PCN reaction that required hospitalization {Yes/No:30480221}NO Has patient had a PCN reaction occurring within the last 10 years: {Yes/No:30480221}No If all of the above answers are NO, then may proceed with Cephalosporin     FAMILY HISTORY: Family History  Problem Relation Age of Onset   Other Mother        unknown medical history   Hypertension Father     Objective:  *** General: No acute distress.  Patient appears well-groomed.   Head:  Normocephalic/atraumatic Eyes:  fundi examined but not visualized Neck: supple, no paraspinal tenderness, full range of motion Heart: regular rate and rhythm Neurological Exam: Mental status: alert and oriented to person, place, and time, speech fluent and not dysarthric, language intact. Cranial nerves: CN I: not tested CN II: pupils equal, round and reactive to light, visual fields intact CN III, IV, VI:  full range of motion, no nystagmus, no ptosis CN V: facial sensation intact. CN VII: upper and lower face symmetric CN VIII: hearing intact CN IX, X: gag intact, uvula midline CN XI: sternocleidomastoid and trapezius muscles intact CN XII: tongue midline Bulk & Tone: normal, no fasciculations. Motor:  muscle strength 5/5 throughout Sensation:  Pinprick and vibratory sensation intact. Deep Tendon Reflexes:  2+ throughout,  toes downgoing.   Finger to nose testing:  Without dysmetria.   Gait:  Normal station and stride.  Romberg negative.    Thank you for allowing me to take part in the care of this patient.  Juliene Dunnings, DO  CC:  ***

## 2023-10-26 ENCOUNTER — Ambulatory Visit: Admitting: Neurology

## 2023-10-26 ENCOUNTER — Encounter: Payer: Self-pay | Admitting: Neurology

## 2023-11-15 ENCOUNTER — Ambulatory Visit: Admitting: Neurology
